# Patient Record
Sex: Female | Born: 1955 | Race: White | Hispanic: No | Marital: Married | Smoking: Former smoker
Health system: Southern US, Community
[De-identification: ages and names within clinical notes are randomized; demographics above are authoritative.]

## PROBLEM LIST (undated history)

## (undated) DIAGNOSIS — N809 Endometriosis, unspecified: Secondary | ICD-10-CM

## (undated) DIAGNOSIS — K7030 Alcoholic cirrhosis of liver without ascites: Secondary | ICD-10-CM

## (undated) DIAGNOSIS — F329 Major depressive disorder, single episode, unspecified: Secondary | ICD-10-CM

## (undated) DIAGNOSIS — K219 Gastro-esophageal reflux disease without esophagitis: Secondary | ICD-10-CM

## (undated) DIAGNOSIS — K922 Gastrointestinal hemorrhage, unspecified: Secondary | ICD-10-CM

## (undated) DIAGNOSIS — D649 Anemia, unspecified: Secondary | ICD-10-CM

## (undated) DIAGNOSIS — J449 Chronic obstructive pulmonary disease, unspecified: Secondary | ICD-10-CM

## (undated) DIAGNOSIS — F419 Anxiety disorder, unspecified: Secondary | ICD-10-CM

## (undated) DIAGNOSIS — F32A Depression, unspecified: Secondary | ICD-10-CM

## (undated) DIAGNOSIS — K589 Irritable bowel syndrome without diarrhea: Secondary | ICD-10-CM

## (undated) HISTORY — DX: Major depressive disorder, single episode, unspecified: F32.9

## (undated) HISTORY — PX: TUBAL LIGATION: SHX77

## (undated) HISTORY — DX: Endometriosis, unspecified: N80.9

## (undated) HISTORY — DX: Depression, unspecified: F32.A

## (undated) HISTORY — PX: TONSILLECTOMY: SUR1361

## (undated) HISTORY — DX: Irritable bowel syndrome, unspecified: K58.9

## (undated) HISTORY — PX: OTHER SURGICAL HISTORY: SHX169

## (undated) HISTORY — PX: LAPAROSCOPIC LYSIS OF ADHESIONS: SHX5905

## (undated) HISTORY — PX: DILATION AND CURETTAGE OF UTERUS: SHX78

---

## 2006-04-26 ENCOUNTER — Ambulatory Visit: Payer: Self-pay | Admitting: Obstetrics and Gynecology

## 2006-08-10 ENCOUNTER — Emergency Department: Payer: Self-pay | Admitting: Emergency Medicine

## 2010-03-20 ENCOUNTER — Ambulatory Visit: Payer: Self-pay | Admitting: Unknown Physician Specialty

## 2010-04-05 ENCOUNTER — Ambulatory Visit: Payer: Self-pay | Admitting: Unknown Physician Specialty

## 2010-04-06 LAB — PATHOLOGY REPORT

## 2012-08-04 ENCOUNTER — Emergency Department: Payer: Self-pay | Admitting: Emergency Medicine

## 2012-08-04 LAB — URINALYSIS, COMPLETE
Bilirubin,UR: NEGATIVE
Blood: NEGATIVE
Glucose,UR: NEGATIVE mg/dL (ref 0–75)
Leukocyte Esterase: NEGATIVE
Nitrite: NEGATIVE
RBC,UR: 1 /HPF (ref 0–5)
Squamous Epithelial: 9

## 2012-08-04 LAB — COMPREHENSIVE METABOLIC PANEL
Albumin: 3.4 g/dL (ref 3.4–5.0)
Alkaline Phosphatase: 134 U/L (ref 50–136)
Anion Gap: 13 (ref 7–16)
BUN: 5 mg/dL — ABNORMAL LOW (ref 7–18)
Bilirubin,Total: 1.3 mg/dL — ABNORMAL HIGH (ref 0.2–1.0)
Calcium, Total: 8.8 mg/dL (ref 8.5–10.1)
Chloride: 101 mmol/L (ref 98–107)
Creatinine: 0.57 mg/dL — ABNORMAL LOW (ref 0.60–1.30)
EGFR (African American): >60
Osmolality: 269 (ref 275–301)
Potassium: 3.1 mmol/L — ABNORMAL LOW (ref 3.5–5.1)

## 2012-08-04 LAB — CBC
HGB: 16.2 g/dL — ABNORMAL HIGH (ref 12.0–16.0)
MCH: 35.8 pg — ABNORMAL HIGH (ref 26.0–34.0)
MCHC: 35.5 g/dL (ref 32.0–36.0)
MCV: 101 fL — ABNORMAL HIGH (ref 80–100)
Platelet: 130 10*3/uL — ABNORMAL LOW (ref 150–440)
RBC: 4.54 10*6/uL (ref 3.80–5.20)
RDW: 12.5 % (ref 11.5–14.5)

## 2012-08-04 LAB — LIPASE, BLOOD: Lipase: 90 U/L (ref 73–393)

## 2012-08-04 LAB — RAPID INFLUENZA A&B ANTIGENS

## 2012-08-06 LAB — URINE CULTURE

## 2012-08-10 LAB — CULTURE, BLOOD (SINGLE)

## 2012-12-04 ENCOUNTER — Ambulatory Visit: Payer: Self-pay | Admitting: Otolaryngology

## 2012-12-10 ENCOUNTER — Ambulatory Visit: Payer: Self-pay | Admitting: Unknown Physician Specialty

## 2013-02-10 ENCOUNTER — Ambulatory Visit: Payer: Self-pay

## 2013-02-10 LAB — CBC WITH DIFFERENTIAL/PLATELET
Basophil %: 1.5 %
Eosinophil %: 1.3 %
HCT: 41.6 % (ref 35.0–47.0)
HGB: 14.4 g/dL (ref 12.0–16.0)
Lymphocyte #: 0.3 10*3/uL — ABNORMAL LOW (ref 1.0–3.6)
Lymphocyte %: 11.2 %
MCH: 34.2 pg — ABNORMAL HIGH (ref 26.0–34.0)
MCHC: 34.6 g/dL (ref 32.0–36.0)
Monocyte %: 10.4 %
Neutrophil #: 2.1 10*3/uL (ref 1.4–6.5)
Neutrophil %: 75.6 %
Platelet: 96 10*3/uL — ABNORMAL LOW (ref 150–440)
RDW: 14.5 % (ref 11.5–14.5)
WBC: 2.8 10*3/uL — ABNORMAL LOW (ref 3.6–11.0)

## 2013-02-10 LAB — COMPREHENSIVE METABOLIC PANEL
Albumin: 3.5 g/dL (ref 3.4–5.0)
Alkaline Phosphatase: 195 U/L — ABNORMAL HIGH (ref 50–136)
Anion Gap: 12 (ref 7–16)
BUN: 7 mg/dL (ref 7–18)
Calcium, Total: 8.9 mg/dL (ref 8.5–10.1)
Chloride: 97 mmol/L — ABNORMAL LOW (ref 98–107)
Co2: 23 mmol/L (ref 21–32)
Creatinine: 0.77 mg/dL (ref 0.60–1.30)
Glucose: 110 mg/dL — ABNORMAL HIGH (ref 65–99)
Potassium: 3.6 mmol/L (ref 3.5–5.1)
SGOT(AST): 389 U/L — ABNORMAL HIGH (ref 15–37)
Sodium: 132 mmol/L — ABNORMAL LOW (ref 136–145)

## 2013-02-10 LAB — URINALYSIS, COMPLETE
Bilirubin,UR: NEGATIVE
Glucose,UR: NEGATIVE mg/dL (ref 0–75)
Ketone: NEGATIVE
Leukocyte Esterase: NEGATIVE
Ph: 8 (ref 4.5–8.0)

## 2013-02-10 LAB — MAGNESIUM: Magnesium: 1.2 mg/dL — ABNORMAL LOW

## 2013-02-13 LAB — BETA STREP CULTURE(ARMC)

## 2013-03-04 ENCOUNTER — Ambulatory Visit: Payer: Self-pay | Admitting: Family Medicine

## 2013-03-26 ENCOUNTER — Ambulatory Visit: Payer: Self-pay | Admitting: Otolaryngology

## 2013-10-01 ENCOUNTER — Ambulatory Visit: Payer: Self-pay | Admitting: Otolaryngology

## 2014-04-14 ENCOUNTER — Ambulatory Visit: Payer: Self-pay | Admitting: Unknown Physician Specialty

## 2014-04-20 ENCOUNTER — Emergency Department: Payer: Self-pay | Admitting: Emergency Medicine

## 2014-04-20 LAB — URINALYSIS, COMPLETE
BACTERIA: NONE SEEN
BILIRUBIN, UR: NEGATIVE
Blood: NEGATIVE
GLUCOSE, UR: NEGATIVE mg/dL (ref 0–75)
KETONE: NEGATIVE
Leukocyte Esterase: NEGATIVE
NITRITE: NEGATIVE
PH: 6 (ref 4.5–8.0)
Protein: NEGATIVE
RBC,UR: <1 /HPF (ref 0–5)
Specific Gravity: 1.004 (ref 1.003–1.030)
Squamous Epithelial: 1
WBC UR: 1 /HPF (ref 0–5)

## 2014-04-20 LAB — CBC WITH DIFFERENTIAL/PLATELET
BASOS PCT: 1.5 %
Basophil #: 0.1 10*3/uL (ref 0.0–0.1)
EOS ABS: 0.3 10*3/uL (ref 0.0–0.7)
Eosinophil %: 6.7 %
HCT: 41.7 % (ref 35.0–47.0)
HGB: 13.7 g/dL (ref 12.0–16.0)
LYMPHS ABS: 1.3 10*3/uL (ref 1.0–3.6)
LYMPHS PCT: 30.6 %
MCH: 31.3 pg (ref 26.0–34.0)
MCHC: 32.9 g/dL (ref 32.0–36.0)
MCV: 95 fL (ref 80–100)
Monocyte #: 0.4 x10 3/mm (ref 0.2–0.9)
Monocyte %: 9.7 %
NEUTROS ABS: 2.1 10*3/uL (ref 1.4–6.5)
Neutrophil %: 51.5 %
Platelet: 101 10*3/uL — ABNORMAL LOW (ref 150–440)
RBC: 4.38 10*6/uL (ref 3.80–5.20)
RDW: 12.7 % (ref 11.5–14.5)
WBC: 4.2 10*3/uL (ref 3.6–11.0)

## 2014-04-20 LAB — COMPREHENSIVE METABOLIC PANEL
ALBUMIN: 3.8 g/dL (ref 3.4–5.0)
AST: 152 U/L — AB (ref 15–37)
Alkaline Phosphatase: 155 U/L — ABNORMAL HIGH
Anion Gap: 10 (ref 7–16)
BILIRUBIN TOTAL: 0.7 mg/dL (ref 0.2–1.0)
BUN: 7 mg/dL (ref 7–18)
CALCIUM: 9 mg/dL (ref 8.5–10.1)
CHLORIDE: 107 mmol/L (ref 98–107)
CREATININE: 0.69 mg/dL (ref 0.60–1.30)
Co2: 24 mmol/L (ref 21–32)
EGFR (African American): >60
EGFR (Non-African Amer.): >60
Glucose: 124 mg/dL — ABNORMAL HIGH (ref 65–99)
Osmolality: 281 (ref 275–301)
POTASSIUM: 3.5 mmol/L (ref 3.5–5.1)
SGPT (ALT): 99 U/L — ABNORMAL HIGH
Sodium: 141 mmol/L (ref 136–145)
Total Protein: 7.6 g/dL (ref 6.4–8.2)

## 2014-04-20 LAB — LIPASE, BLOOD: Lipase: 187 U/L (ref 73–393)

## 2014-04-20 LAB — TROPONIN I: Troponin-I: <0.02 ng/mL

## 2014-10-26 ENCOUNTER — Ambulatory Visit
Admit: 2014-10-26 | Disposition: A | Discharge status: Home / Self Care | Payer: Self-pay | Attending: Oncology | Admitting: Oncology

## 2014-11-11 ENCOUNTER — Ambulatory Visit: Payer: Self-pay | Admitting: Family Medicine

## 2014-11-11 ENCOUNTER — Inpatient Hospital Stay: Payer: Self-pay | Admitting: Internal Medicine

## 2014-11-26 ENCOUNTER — Ambulatory Visit
Admit: 2014-11-26 | Disposition: A | Discharge status: Home / Self Care | Payer: Self-pay | Attending: Oncology | Admitting: Oncology

## 2014-11-26 LAB — CBC CANCER CENTER
Basophil #: 0.1 x10 3/mm (ref 0.0–0.1)
Basophil %: 2.4 %
EOS PCT: 2.8 %
Eosinophil #: 0.1 x10 3/mm (ref 0.0–0.7)
HCT: 31.8 % — ABNORMAL LOW (ref 35.0–47.0)
HGB: 10.4 g/dL — AB (ref 12.0–16.0)
LYMPHS ABS: 0.8 x10 3/mm — AB (ref 1.0–3.6)
Lymphocyte %: 27.9 %
MCH: 27.9 pg (ref 26.0–34.0)
MCHC: 32.7 g/dL (ref 32.0–36.0)
MCV: 85 fL (ref 80–100)
MONOS PCT: 8.8 %
Monocyte #: 0.2 x10 3/mm (ref 0.2–0.9)
Neutrophil #: 1.6 x10 3/mm (ref 1.4–6.5)
Neutrophil %: 58.1 %
Platelet: 103 x10 3/mm — ABNORMAL LOW (ref 150–440)
RBC: 3.73 10*6/uL — AB (ref 3.80–5.20)
RDW: 17.9 % — ABNORMAL HIGH (ref 11.5–14.5)
WBC: 2.8 x10 3/mm — ABNORMAL LOW (ref 3.6–11.0)

## 2014-11-26 LAB — IRON AND TIBC
IRON BIND. CAP.(TOTAL): 291 (ref 250–450)
IRON: 59 ug/dL
Iron Saturation: 20.3
Unbound Iron-Bind.Cap.: 231.8

## 2014-11-26 LAB — LACTATE DEHYDROGENASE: LDH: 124 U/L

## 2014-11-26 LAB — FERRITIN: FERRITIN (ARMC): 198 ng/mL

## 2014-11-26 LAB — RETICULOCYTES
Absolute Retic Count: 0.0935 10*6/uL (ref 0.019–0.186)
Reticulocyte: 2.48 % (ref 0.4–3.1)

## 2014-12-11 LAB — CULTURE, BLOOD (SINGLE)

## 2014-12-26 NOTE — Consult Note (Signed)
PATIENT NAME:  Holly Evans, Holly Evans MR#:  [id number] DATE OF BIRTH:  Jul 08, 1956  DATE OF CONSULTATION:  11/12/2014  REFERRING PHYSICIAN:   CONSULTING PHYSICIAN:  Cala BradfordKimberly A. Arvilla MarketMills, ANP (Adult Nurse Practitioner)  REFERRING PHYSICIAN:  Enid Baasadhika Kalisetti, MD.    CONSULTING PHYSICIAN:  Lynnae Prudeobert Elliott, MD/Lorene Samaan Arvilla MarketMills, ANP.  REASON FOR CONSULTATION:  Anemia with melena.   HISTORY OF PRESENT ILLNESS: This 59 year old Caucasian female with history of ongoing alcohol abuse, depression, IBS, and fatty liver, pancytopenia presented to the Emergency Room from the urgent care secondary to fever, cough, vomiting and inability to "keep anything down for 3 days." The patient reports for the last few days she has had episodes of vomiting, fever, cough productive of light yellow sputum. She has noted black stools a few times last week. There has been minor diarrhea. No hematemesis. She has noticed bleeding gums and sought care from her dentist who could not find anything wrong with her gums.  Her ribs hurt bilaterally from coughing. She initially thought this was a cold.  She presented to the Emergency Room with a fever of 101 degrees, WBC 2.0, hemoglobin 9.3, platelet count 71,000, AST 188, alkaline phosphatase 125, bilirubin 1.5, and albumin 3.3. Lipase was 26. Stool was positive for occult blood. INR 1.3. Chest x-ray showed a right basilar opacity, may reflect atelectasis versus pneumonia.   She was subsequently hospitalized last evening and started on IV antibiotic therapy for sepsis. Hemoglobin has been monitored and dropped to 7.4 today. She has been on IV Protonix drip. Abdominal ultrasound showed marked splenomegaly. Consultation with oncology has been requested.   PAST MEDICAL HISTORY:  1. Depression.  2. Irritable bowel syndrome.  3. Alcohol abuse.  4. Fatty liver with mild ascites in the past.  5. Insomnia.  6. COPD.    PAST SURGICAL HISTORY:   1. Tubal ligation.  2. D and C.  3. Tonsillectomy.   4. Lysis of abdominal adhesions.  5. Thermal ablation endometrium.  6. Colonoscopy in 2011, hyperplastic colon polyps.   ALLERGIES:  TO SULFA.     CURRENT MEDICATIONS:  1. Prozac 40 mg daily.  2. Probiotics 1 capsule daily.  3. Omeprazole 20 mg daily.   SOCIAL HISTORY: Lives at home with her husband. Quit smoking 3 years ago. Drinks 4-5 glasses of vodka every day.   FAMILY HISTORY: Father deceased with sudden death in his 6450s. Negative for cancer.   REVIEW OF SYSTEMS: Positive for fever, fatigue, weakness, cough, sputum production, a little short of breath. Positive for rib discomfort bilateral. No discreet abdominal pain. She has noted no increase in abdominal girth or edema. She denies history of ascites or paracentesis. She has had vomiting, unable to retain fluids for 2 days prior to admission. Black stools noted last week, a little diarrhea. Positive gum bleeding. Remaining 10 systems otherwise negative.   PHYSICAL EXAMINATION:  VITAL SIGNS: 98.9, 89, 20, 116/72, pulse oximetry on room air is 93%.  GENERAL:  Well-nourished Caucasian female, looks fatigue, resting in bed. Noncritical.  HEENT: Head is normocephalic. Conjunctivae pink. Sclerae are anicteric.  NECK: Supple. Trachea midline.  CARDIAC: S1, S2 without murmur or gallop.  PULMONARY: Decreased breath sounds bilateral, few crackles, diffuse wheezes noted. Little cough noted.  ABDOMEN:  Soft and a little protuberant. Nontender in all quadrants. Positive splenomegaly.  EXTREMITIES: Lower extremities without edema, cyanosis, or clubbing.  SKIN: Warm and dry without rash. Spider angiomata NEUROLOGIC: Cranial nerves grossly intact. Follows commands.  PSYCHOLOGICAL: Affect and mood within normal.  She is alert and awake, interacting appropriately with her husband.  LABORATORY: Laboratory studies dated 11/11/2014 with BUN 8, creatinine 0.62, glucose 97. Electrolytes unremarkable. Calcium 7.8. Ferritin 44. TSH 1.153. WBC 1.6,  hemoglobin 8.1, platelets 63,000. MCV 85. Blood culture negative x 2.  Urinalysis, 1 + blood, 3 WBCs per high-power field, positive mucus.   Repeat laboratory studies today with hemoglobin 7.4.   RADIOLOGY: Chest x-ray PA and lateral performed 11/11/2014 with minimal apparent right basilar opacity, may reflect atelectasis or possibly mild pneumonia.   Abdominal ultrasound, limited survey for pancytopenia and splenomegaly showed marked splenomegaly. Accurate measurement difficult as spleen extended beyond the limits of the imaging field. The spleen is homogenous, without focal mass or fluid collection. No mention of the liver.   IMPRESSION:  1.  The patient presents with cough, fever, sputum production, and abnormal chest x-ray, and sepsis, favoring diagnosis of pneumonia. She has been started on Rocephin and azithromycin. Nebulizers have been ordered as needed. Lung exam confirms.  2.  She reports nausea and vomiting without hematemesis. She reports black stools last week. She has experienced gum bleeding. She reports poor intake over 2 days. This may be related to her initial infection, but the patient is at high risk for ulcer, gastritis, erosive gastritis, portal hypertensive gastropathy, and spontaneous bacterial peritonitis (no abdominal pain or tenderness).  3.  History of alcoholic liver disease and severe pancytopenia supproting cirrhosis. Other diagnoses to be determined pending  hematology consult.   PLAN:  1.  Recommend liver laboratories with hepatitis A, Evans, C, ANA, iron, and TIBC.  2.  Serial laboratory work.  INR is 1.3. Albumin is 3.3, which is good. She will need eventual CT of the abdomen.  3.  No recommendation for luminal evaluation at this time, but certainly would consider as her clinical status improves. We will need to follow her along daily for improvement in her pulmonary and hematological status.   Thank you for the consultation. This case was discussed with Lynnae Prude, MD in collaboration of care.   These services provided by Cala Bradford A. Arvilla Market, MS, APRN, BC, ANP under collaborative agreement with Lynnae Prude, MD.     ____________________________ Ranae Plumber Arvilla Market, ANP (Adult Nurse Practitioner) kam:bu D: 11/12/2014 15:19:16 ET T: 11/12/2014 15:37:13 ET JOB#: [id number]  cc: Cala Bradford A. Arvilla Market, ANP (Adult Nurse Practitioner), <Dictator> Ranae Plumber Suzette Battiest, MSN, ANP-BC Adult Nurse Practitioner ELECTRONICALLY SIGNED 11/12/2014 17:13

## 2014-12-26 NOTE — H&P (Signed)
PATIENT NAME:  Holly Evans, Holly Evans MR#:  [id number] DATE OF BIRTH:  1956-07-14  DATE OF ADMISSION:  11/11/2014  ADMITTING PHYSICIAN: Enid Baas, MD   PRIMARY CARE PHYSICIAN: [facility] in [place].   CHIEF COMPLAINT: Cough, diarrhea, vomiting, and abdominal pain and fevers.   HISTORY OF PRESENT ILLNESS: Ms. Dolson is a 59 year old Caucasian female with past medical history significant for alcohol abuse, depression, IBS, fatty liver, presents to the hospital  from urgent care secondary to fever and pancytopenia. The patient states her symptoms started about 5-6 days ago. Initially, she had cold, congestion, upper respiratory tract infection symptoms with worsening cough. Her cough became very productive, and she started vomiting after coughing spell. She has been having fevers for the last couple of days and she had a temperature of 101 degrees Fahrenheit at the urgent care. She denies any chest pain. Denies any dyspnea. She does have chronic diarrhea and that has not changed; however, she noticed that her stools were dark and tarry. She was told that she has fatty liver and mild ascites about a couple of years ago. No further follow-up was done. She is noted to have decreased white blood cells, red blood cells, and platelets here. No active bleeding at this time. Vitals are otherwise stable.   PAST MEDICAL HISTORY: 1. Depression.  2. Irritable bowel syndrome.  3. Fatty liver and mild ascites in the past.   PAST SURGICAL HISTORY: 1. Breast implants.  2. Endometriosis surgery.  3. D and Cs.  ALLERGIES TO MEDICATIONS: SULFA.   CURRENT HOME MEDICATIONS:  1. Prozac 40 mg p.o. daily.  2. Probiotics 1 capsule daily.  3. Omeprazole 20 mg p.o. daily.   SOCIAL HISTORY: Lives at home with her husband. Quit smoking about 3 years ago. Prior to that, was smoking about a pack per day. Drinks about 4-5 glasses of vodka every day.    FAMILY HISTORY: Dad died from sudden death when  in his 67s, and  mom is 33 and is still healthy and drives around.   REVIEW OF SYSTEMS:  CONSTITUTIONAL: Positive for fever, fatigue, and weakness.  EYES: No blurred vision, double vision, inflammation, or glaucoma.  EARS, NOSE, AND THROAT: No tinnitus, ear pain, hearing loss, epistaxis, or discharge.  RESPIRATORY: Positive for cough. No wheeze. No COPD, hemoptysis.  CARDIOVASCULAR: No chest pain, orthopnea, edema, arrhythmia, palpitations, or syncope.  GASTROINTESTINAL: Positive for nausea, vomiting, and diarrhea. Positive for abdominal pain. No hematemesis. Positive for melena.  GENITOURINARY: No dysuria, hematuria, renal calculus, frequency, or incontinence.  ENDOCRINE: No polyuria, nocturia, thyroid problems, heat or cold intolerance.  HEMATOLOGY: No anemia, easy bruising or bleeding.  SKIN: No acne, rash or lesions.  MUSCULOSKELETAL: No neck, back, shoulder pain, arthritis, or gout.  NEUROLOGIC: No numbness, weakness, CVA, TIA, or seizures.  PSYCHOLOGIC: No anxiety, insomnia, or depression.   PHYSICAL EXAMINATION: VITAL SIGNS: Temperature 101 degrees Fahrenheit, pulse is 111, blood pressure is 132/76, pulse oximetry is 97% on room air.  GENERAL: Well-built, well-nourished female, sitting in bed, not in any acute distress.  HEENT: Normocephalic, atraumatic. Pupils are equal, round, reacting to light. Anicteric sclerae. Extraocular movements intact. Oropharynx is clear without erythema, mass, or exudates.  NECK: Supple. No thyromegaly, JVD, or carotid bruits. No lymphadenopathy.  LUNGS: Moving air bilaterally. Decreased bibasilar breath sounds and coarse rhonchi at the bases, more worse on the right side. No wheeze. No crackles heard. No use of accessory muscles for breathing.  CARDIOVASCULAR: S1, S2, regular rate and rhythm. No  murmurs, rubs, or gallops.  ABDOMEN: Soft, nontender, nondistended. No hepatosplenomegaly. Normal bowel sounds.  EXTREMITIES: No pedal edema. No clubbing or cyanosis, 2+ dorsalis  pedis pulses palpable bilaterally.  SKIN: No acne, rash, or lesions.   NEUROLOGIC: Cranial nerves intact. No focal motor or sensory deficits.  PSYCHOLOGICAL: The patient is awake, alert x 3.   LABORATORY DATA: WBC 2.0, hemoglobin 9.3, hematocrit 28.0, platelet count 71,000.  Sodium 134, potassium 3.7, chloride 104, bicarbonate 22, BUN 9, creatinine 0.64, glucose 135, and calcium of 8.5. ALT 58, AST 188, alkaline phosphatase 125, total bilirubin 1.5 and albumin of 3.3. Lipase is 26. Stool for Occult blood is positive. INR is 1.3. Insulin test is negative. Chest x-ray showing well aerated lungs; however, right basilar opacity may reflect atelectasis or possibly pneumonia.   ASSESSMENT AND PLAN: This is a 59 year old female with history of gastroesophageal reflux disease, alcohol abuse, irritable bowel syndrome, and depression, went to urgent care for cough, fever, noted to be tachycardic and pancytopenic.  1. Sepsis with tachycardia, fever secondary to pneumonia. Blood cultures have been ordered. Start her on Rocephin and azithromycin at this time. Nebulizers p.r.n. and cough medicine have been added, as well.  2. Melena. Check hemoglobin every 6-8 hours. Gastroenterology has been consulted. Started on IV Protonix b.i.d. at this time. Last colonoscopy was 5 years ago showing only polyps, nonmalignant. No esophagogastroduodenoscopy noted. No history of nonsteroidal anti-inflammatory drug use. Monitor for now.  3. Pancytopenia known history of alcohol abuse. Could be alcohol-induced pancytopenia from chronic alcohol use versus viral induced. She probably has underlying cirrhosis. Check reticulocyte count, follow up manual differential count, parvovirus PCR, ultrasound abdomen to evaluate for splenomegaly and oncology has been consulted. ANC is noted to be 1300.  4. Depression. Continue Prozac.  5. Alcohol abuse. Placed on CIWA protocol.  6. Deep vein thrombosis prophylaxis, TEDs and SCDs only.   CODE  STATUS: Full code.   TIME SPENT ON ADMISSION: 50 minutes.    ____________________________ Enid Baasadhika Telia Amundson, MD rk:mw D: 11/11/2014 19:22:12 ET T: 11/11/2014 19:56:34 ET JOB#: [id number]  cc: Enid Baasadhika Gabriellah Rabel, MD, <Dictator> [facility] [place] Enid BaasADHIKA Stephenson Cichy MD ELECTRONICALLY SIGNED 11/20/2014 17:54

## 2014-12-26 NOTE — Discharge Summary (Signed)
PATIENT NAME:  Holly Evans, Holly Evans MR#:  [id number] DATE OF BIRTH:  [birth year]  DATE OF ADMISSION:  11/11/2014 DATE OF DISCHARGE:  11/14/2014  PRIMARY CARE PHYSICIAN: Dr. Elmer Ramp - [place]  CHIEF COMPLAINT: Cough, vomiting, abdominal pain and fevers.   DISCHARGE DIAGNOSES: 1.  Pneumonia.  2.  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  3.  Iron deficiency anemia.  4.  History of alcohol abuse.   CONDITION AT DISCHARGE:  Saturations 91 to 93% on room.   CODE STATUS: FULL.  DISCHARGE MEDICATIONS:  1.  Keflex 500 mg b.i.d.  2.  Azithromycin 250 p.o. daily.  3.  Multivitamin p.o. daily.  4.  Tessalon Perles 100 mg 1 capsule q. 6 hourly as needed.  5.  Cetirizine 10 mg daily.  6.  Fluoxetine 40 mg p.o. daily.  7.  Omeprazole 20 mg daily.   DISCHARGE DIET: Regular, mechanical soft.  DISCHARGE INSTRUCTIONS: 1.  Follow up with Dr. Orlie Dakin, [facility], for anemia.  2.  Follow up with Dr. Elmer Ramp at [facility].  3.  Follow up with Dr. Mechele Collin, gastroenterology, in 2 to 4 weeks for cirrhosis of the liver and further gastrointestinal evaluation.   DIAGNOSTIC DATA: Ultrasound of the abdomen showed abundant sludge and numerous small gallstones with mild gallbladder wall thickening. Gallbladder wall thickening may be related to adjacent hepatic parenchymal disease. There is no sonographic Murphy sign. Diffusely coarse hepatic echotexture with mild nodularity consistent with diagnosis of cirrhosis.  Hepatitis panel is negative, except hepatitis A antibody is positive.   Acetaminophen 23. Hemoglobin and hematocrit 7.9 and 25.1, white count 3.1, and platelet count 61,000. Metabolic panel within normal limits, except potassium of 2.2 and bicarb of 19.   Urinalysis negative for UTI. Blood cultures negative in 48 hours.   Chest x-ray showed minimal apparent right basilar opacity, may reflect atelectasis and/or possibly mild pneumonia.   CONSULTANTS:  1.  Gastroenterology - Dr. Mechele Collin.  2.   Oncology/hematology - Dr. Orlie Dakin.  BRIEF SUMMARY OF HOSPITAL COURSE: Holly Evans is a 59 year old Caucasian female with past medical history of alcohol abuse, heartburn, irritable bowel syndrome and depression. She went to urgent care with worsening cough and melena for 5 days. She was noted to have fever of 101, was admitted with:  1.  Sepsis, which was present on admission with tachycardia, fever. Suspected due to pneumonia. Resolved during the hospital stay. She was started on IV Rocephin and Zithromax, changed to p.o. Keflex and Zithromax. Blood cultures remained negative. P.r.n. cough medicine was given. Pancytopenia with known history of alcohol abuse. Neutropenic precautions were carried out. She was started on IV Feraheme per Dr. Orlie Dakin for her iron deficiency anemia and she will follow up with him as outpatient.  2.  GI bleed. Given melena and history of alcohol abuse, possibly has underlying gastritis/esophageal varices. Hemoglobin and hematocrit were stable. No active bleeding noted. She was seen by Dr. Mechele Collin. The patient was advised to follow up with Dr. Mechele Collin to get further GI evaluation as outpatient.  3.  Cirrhosis of liver, alcohol-induced, with abdominal ultrasound positive for cirrhosis of liver. The patient was advised abstinence from alcohol. She agreed to it. 4.  Depression. Continue Prozac.   Hospital stay otherwise remained stable. The patient remained a FULL code.   TIME SPENT: 40 minutes. ____________________________ Wylie Hail Allena Katz, MD sap:sb D: 11/15/2014 07:23:35 ET T: 11/15/2014 08:10:58 ET JOB#: [id number]  cc: Eleonora Peeler A. Allena Katz, MD, <Dictator> Tollie Pizza. Orlie Dakin, MD Dr. Leanna Sato, MD  [name] MD ELECTRONICALLY SIGNED 11/29/2014 12:16

## 2014-12-26 NOTE — Consult Note (Signed)
Pt with coughing up a little blood, chest exam with decreased breath sounds right base, some on left.  WBC 2.1, plt ct 61K.  US showed sludge and stones and cirrhosis.  VSS afebrile. No new suggestions but I did suggest to her that she go to AA after discharge if she was serious about stopping alcohol.  Electronic Signatures: Scot JunElliott, Robert T (MD)  (Signed on 19-Mar-16 12:15)  Authored  Last Updated: 19-Mar-16 12:15 by Scot JunElliott, Robert T (MD)

## 2014-12-26 NOTE — Consult Note (Signed)
Note Type Consult   Subjective: Chief Complaint/Diagnosis:   Pancytopenia HPI:   Patient is a 59 year old female who presented to the emergency room complaining of increased cough, diarrhea, vomiting, and abdominal pain. She also reported fevers. Patient's symptoms started approximately one week ago and became worse. Currently she feels improved, but not back to baseline. She has no neurologic complaints. She has good appetite and denies weight loss. She denies any chest pain or shortness of breath. She has no urinary complaints. Patient otherwise feels well and offers no further specific complaints.   Review of Systems:  Performance Status (ECOG): 0  Skin: no complaints  no easy bleeding or bruising.  Review of Systems:   As per HPI. Otherwise, a complete review of systems is negative.   Allergies:  Other -Explain in Comment: Anaphylaxis  Sulfa drugs: N/V/Diarrhea  PFSH: Additional Past Medical and Surgical History: depression, IBS, breast implants, endometriosis, D&C.    Family history: Father "sudden death" from unknown causes in his 43s. No other pertinent family history.    Social history: Positive tobacco, quit 3 years ago. Heavy alcohol use approximately 4-5 glasses of vodka daily.   Home Medications: Medication Instructions Last Modified Date/Time  omeprazole 20 mg oral delayed release tablet 1 tab(s) orally once a day (in the morning) 17-Mar-16 18:29  FLUoxetine 40 mg oral capsule 1 cap(s) orally once a day (in the morning) 17-Mar-16 18:29  cetirizine 10 mg oral tablet 1 tab(s) orally once a day (in the morning) 17-Mar-16 18:29   Vital Signs:  :: vital signs stable, patient afebrile.   Physical Exam:  General: well developed, well nourished, and in no acute distress  Mental Status: normal affect  Eyes: anicteric sclera  Respiratory: clear to auscultation bilaterally  Cardiovascular: regular rate and rhythm, no murmur, rub, or gallop  Gastrointestinal: soft,  nondistended, nontender, no organomegaly.  normal active bowel sounds  Musculoskeletal: No edema  Skin: No rash or petechiae noted  Neurological: alert, answering all questions appropriately.  Cranial nerves grossly intact   Laboratory Results: Routine Chem:  19-Mar-16 04:31   Glucose, Serum 95 (65-99 NOTE: New Reference Range  10/19/14)  BUN 6 (6-20 NOTE: New Reference Range  10/19/14)  Creatinine (comp) 0.58 (0.44-1.00 NOTE: New Reference Range  10/19/14)  Sodium, Serum 139 (135-145 NOTE: New Reference Range  10/19/14)  Potassium, Serum  3.2 (3.5-5.1 NOTE: New Reference Range  10/19/14)  Chloride, Serum  114 (101-111 NOTE: New Reference Range  10/19/14)  CO2, Serum  19 (22-32 NOTE: New Reference Range  10/19/14)  Calcium (Total), Serum  7.9 (8.9-10.3 NOTE: New Reference Range  10/19/14)  Anion Gap  6  eGFR (African American) >60  eGFR (Non-African American) >60 (eGFR values <42m/min/1.73 m2 may be an indication of chronic kidney disease (CKD). Calculated eGFR is useful in patients with stable renal function. The eGFR calculation will not be reliable in acutely ill patients when serum creatinine is changing rapidly. It is not useful in patients on dialysis. The eGFR calculation may not be applicable to patients at the low and high extremes of body sizes, pregnant women, and vegetarians.)  Iron Binding Capacity (TIBC) 363  Unbound Iron Binding Capacity 340.0  Iron, Serum  23 (28-170 NOTE: New Reference Range  10/19/14)  Iron Saturation 6 (Result(s) reported on 13 Nov 2014 at 07:19AM.)  Routine Hem:  19-Mar-16 04:31   WBC (CBC)  2.1  RBC (CBC)  2.95  Hemoglobin (CBC)  7.9  Hematocrit (CBC)  25.1  Platelet  Count (CBC)  61  MCV 85  MCH 26.9  MCHC  31.6  RDW  14.7  Neutrophil % 43.2  Lymphocyte % 42.2  Monocyte % 8.7  Eosinophil % 4.7  Basophil % 1.2  Neutrophil #  0.9  Lymphocyte #  0.9  Monocyte # 0.2  Eosinophil # 0.1  Basophil # 0.0 (Result(s)  reported on 13 Nov 2014 at [facility].)   Lab Results Review:  Lab Results     Medical Imaging Results:   Review Medical Imaging   Abdomen Limited Survey 12-Nov-2014 08:17:00: IMPRESSION:  Marked splenomegaly.  No focalsplenic abnormality.      Electronically Signed    By: Franchot Gallo M.D.    On: 11/12/2014 08:23     Verified By: Truett Perna, M.D.,  Assessment and Plan: Impression:   pancytopenia. Plan:   1. Pancytopenia: Likely multifactorial given patient's heavy alcoholism, splenomegaly, and iron deficiency anemia. Possible pneumonia and acute illness also likely contributing.  She also had heme positive stools and GI is following. Will give 510 mg IV Feraheme today. Patient can follow-up in the [place] in 3-4 weeks with repeat laboratory work and further evaluation. consult, call with questions.  Electronic Signatures: Delight Hoh (MD)  (Signed 19-Mar-16 15:03)  Authored: Note Type, CC/HPI, Review of Systems, ALLERGIES, Patient Family Social History, HOME MEDICATIONS, Vital Signs, Physical Exam, Lab Results Review, Rad Results Review, Assessment and Plan   Last Updated: 19-Mar-16 15:03 by Delight Hoh (MD)

## 2015-01-20 ENCOUNTER — Other Ambulatory Visit: Payer: Self-pay | Admitting: *Deleted

## 2015-01-20 DIAGNOSIS — D61818 Other pancytopenia: Secondary | ICD-10-CM

## 2015-01-21 ENCOUNTER — Other Ambulatory Visit: Payer: Self-pay

## 2015-01-21 ENCOUNTER — Ambulatory Visit: Payer: Self-pay | Admitting: Oncology

## 2015-01-28 ENCOUNTER — Encounter: Payer: Self-pay | Admitting: Oncology

## 2015-01-28 ENCOUNTER — Inpatient Hospital Stay: Payer: BLUE CROSS/BLUE SHIELD | Attending: Oncology

## 2015-01-28 ENCOUNTER — Inpatient Hospital Stay (HOSPITAL_BASED_OUTPATIENT_CLINIC_OR_DEPARTMENT_OTHER): Payer: BLUE CROSS/BLUE SHIELD | Admitting: Oncology

## 2015-01-28 VITALS — BP 140/65 | HR 84 | Temp 97.1°F | Resp 18 | Wt 150.8 lb

## 2015-01-28 DIAGNOSIS — K7030 Alcoholic cirrhosis of liver without ascites: Secondary | ICD-10-CM | POA: Insufficient documentation

## 2015-01-28 DIAGNOSIS — F329 Major depressive disorder, single episode, unspecified: Secondary | ICD-10-CM

## 2015-01-28 DIAGNOSIS — Z87891 Personal history of nicotine dependence: Secondary | ICD-10-CM

## 2015-01-28 DIAGNOSIS — D509 Iron deficiency anemia, unspecified: Secondary | ICD-10-CM | POA: Diagnosis not present

## 2015-01-28 DIAGNOSIS — K219 Gastro-esophageal reflux disease without esophagitis: Secondary | ICD-10-CM

## 2015-01-28 DIAGNOSIS — F1020 Alcohol dependence, uncomplicated: Secondary | ICD-10-CM | POA: Diagnosis not present

## 2015-01-28 DIAGNOSIS — J449 Chronic obstructive pulmonary disease, unspecified: Secondary | ICD-10-CM

## 2015-01-28 DIAGNOSIS — R161 Splenomegaly, not elsewhere classified: Secondary | ICD-10-CM | POA: Diagnosis not present

## 2015-01-28 DIAGNOSIS — Z8719 Personal history of other diseases of the digestive system: Secondary | ICD-10-CM

## 2015-01-28 DIAGNOSIS — D61818 Other pancytopenia: Secondary | ICD-10-CM

## 2015-01-28 DIAGNOSIS — D729 Disorder of white blood cells, unspecified: Secondary | ICD-10-CM

## 2015-01-28 DIAGNOSIS — F419 Anxiety disorder, unspecified: Secondary | ICD-10-CM

## 2015-01-28 DIAGNOSIS — K589 Irritable bowel syndrome without diarrhea: Secondary | ICD-10-CM

## 2015-01-28 LAB — CBC WITH DIFFERENTIAL/PLATELET
BASOS ABS: 0 10*3/uL (ref 0–0.1)
Basophils Relative: 2 %
Eosinophils Absolute: 0.1 10*3/uL (ref 0–0.7)
HEMATOCRIT: 28 % — AB (ref 35.0–47.0)
HEMOGLOBIN: 9 g/dL — AB (ref 12.0–16.0)
LYMPHS ABS: 0.9 10*3/uL — AB (ref 1.0–3.6)
Lymphocytes Relative: 41 %
MCH: 26 pg (ref 26.0–34.0)
MCHC: 32.3 g/dL (ref 32.0–36.0)
MCV: 80.6 fL (ref 80.0–100.0)
Monocytes Absolute: 0.2 10*3/uL (ref 0.2–0.9)
Neutro Abs: 0.9 10*3/uL — ABNORMAL LOW (ref 1.4–6.5)
Neutrophils Relative %: 42 %
Platelets: 65 10*3/uL — ABNORMAL LOW (ref 150–440)
RBC: 3.47 MIL/uL — ABNORMAL LOW (ref 3.80–5.20)
RDW: 15.5 % — AB (ref 11.5–14.5)
WBC: 2.2 10*3/uL — ABNORMAL LOW (ref 3.6–11.0)

## 2015-02-01 ENCOUNTER — Encounter: Payer: Self-pay | Admitting: *Deleted

## 2015-02-04 ENCOUNTER — Encounter: Payer: Self-pay | Admitting: Anesthesiology

## 2015-02-04 ENCOUNTER — Ambulatory Visit: Payer: BLUE CROSS/BLUE SHIELD | Admitting: Anesthesiology

## 2015-02-04 ENCOUNTER — Encounter
Admission: RE | Disposition: A | Discharge status: Home / Self Care | Payer: Self-pay | Source: Ambulatory Visit | Attending: Unknown Physician Specialty

## 2015-02-04 ENCOUNTER — Ambulatory Visit
Admission: RE | Admit: 2015-02-04 | Discharge: 2015-02-04 | Disposition: A | Discharge status: Home / Self Care | Payer: BLUE CROSS/BLUE SHIELD | Source: Ambulatory Visit | Attending: Unknown Physician Specialty | Admitting: Unknown Physician Specialty

## 2015-02-04 DIAGNOSIS — K589 Irritable bowel syndrome without diarrhea: Secondary | ICD-10-CM | POA: Insufficient documentation

## 2015-02-04 DIAGNOSIS — Z7951 Long term (current) use of inhaled steroids: Secondary | ICD-10-CM | POA: Insufficient documentation

## 2015-02-04 DIAGNOSIS — K766 Portal hypertension: Secondary | ICD-10-CM | POA: Insufficient documentation

## 2015-02-04 DIAGNOSIS — Z87891 Personal history of nicotine dependence: Secondary | ICD-10-CM | POA: Diagnosis not present

## 2015-02-04 DIAGNOSIS — K7030 Alcoholic cirrhosis of liver without ascites: Secondary | ICD-10-CM | POA: Insufficient documentation

## 2015-02-04 DIAGNOSIS — J449 Chronic obstructive pulmonary disease, unspecified: Secondary | ICD-10-CM | POA: Diagnosis not present

## 2015-02-04 DIAGNOSIS — Z79899 Other long term (current) drug therapy: Secondary | ICD-10-CM | POA: Diagnosis not present

## 2015-02-04 DIAGNOSIS — F419 Anxiety disorder, unspecified: Secondary | ICD-10-CM | POA: Diagnosis not present

## 2015-02-04 DIAGNOSIS — K7460 Unspecified cirrhosis of liver: Secondary | ICD-10-CM | POA: Diagnosis present

## 2015-02-04 DIAGNOSIS — F329 Major depressive disorder, single episode, unspecified: Secondary | ICD-10-CM | POA: Insufficient documentation

## 2015-02-04 DIAGNOSIS — K219 Gastro-esophageal reflux disease without esophagitis: Secondary | ICD-10-CM | POA: Diagnosis not present

## 2015-02-04 DIAGNOSIS — D509 Iron deficiency anemia, unspecified: Secondary | ICD-10-CM | POA: Insufficient documentation

## 2015-02-04 DIAGNOSIS — K2970 Gastritis, unspecified, without bleeding: Secondary | ICD-10-CM | POA: Insufficient documentation

## 2015-02-04 DIAGNOSIS — K222 Esophageal obstruction: Secondary | ICD-10-CM | POA: Diagnosis not present

## 2015-02-04 HISTORY — DX: Anemia, unspecified: D64.9

## 2015-02-04 HISTORY — DX: Gastrointestinal hemorrhage, unspecified: K92.2

## 2015-02-04 HISTORY — DX: Gastro-esophageal reflux disease without esophagitis: K21.9

## 2015-02-04 HISTORY — DX: Alcoholic cirrhosis of liver without ascites: K70.30

## 2015-02-04 HISTORY — PX: ESOPHAGOGASTRODUODENOSCOPY: SHX5428

## 2015-02-04 HISTORY — DX: Chronic obstructive pulmonary disease, unspecified: J44.9

## 2015-02-04 HISTORY — DX: Anxiety disorder, unspecified: F41.9

## 2015-02-04 LAB — PROTIME-INR
INR: 1.33
Prothrombin Time: 16.7 seconds — ABNORMAL HIGH (ref 11.4–15.0)

## 2015-02-04 LAB — CBC
HCT: 28.7 % — ABNORMAL LOW (ref 35.0–47.0)
HEMOGLOBIN: 9.2 g/dL — AB (ref 12.0–16.0)
MCH: 26 pg (ref 26.0–34.0)
MCHC: 32 g/dL (ref 32.0–36.0)
MCV: 81.2 fL (ref 80.0–100.0)
PLATELETS: 65 10*3/uL — AB (ref 150–440)
RBC: 3.53 MIL/uL — ABNORMAL LOW (ref 3.80–5.20)
RDW: 15.4 % — ABNORMAL HIGH (ref 11.5–14.5)
WBC: 2.3 10*3/uL — AB (ref 3.6–11.0)

## 2015-02-04 SURGERY — EGD (ESOPHAGOGASTRODUODENOSCOPY)
Anesthesia: General

## 2015-02-04 MED ORDER — LACTATED RINGERS IV SOLN
INTRAVENOUS | Status: DC
  Administered 2015-02-04: 09:00:00 via INTRAVENOUS
  Administered 2015-02-04: 1000 mL via INTRAVENOUS

## 2015-02-04 MED ORDER — FENTANYL CITRATE (PF) 100 MCG/2ML IJ SOLN
INTRAMUSCULAR | Status: DC | PRN
  Administered 2015-02-04: 50 ug via INTRAVENOUS

## 2015-02-04 MED ORDER — SODIUM CHLORIDE 0.9 % IV SOLN: INTRAVENOUS | Status: DC

## 2015-02-04 MED ORDER — PROPOFOL 10 MG/ML IV BOLUS
INTRAVENOUS | Status: DC | PRN
  Administered 2015-02-04 (×2): 70 mg via INTRAVENOUS

## 2015-02-04 MED ORDER — PROPOFOL INFUSION 10 MG/ML OPTIME
INTRAVENOUS | Status: DC | PRN
  Administered 2015-02-04: 120 ug/kg/min via INTRAVENOUS

## 2015-02-04 MED ORDER — LIDOCAINE HCL (CARDIAC) 20 MG/ML IV SOLN
INTRAVENOUS | Status: DC | PRN
  Administered 2015-02-04: 80 mg via INTRAVENOUS

## 2015-02-04 NOTE — Op Note (Signed)
[facility] Patient Name: Holly Evans Procedure Date: 02/04/2015 8:50 AM MRN: [id number] Account #: [id number] Date of Birth: 04/28/1956 Admit Type: Outpatient Age: 59 Room: [facility] ENDO ROOM 1 Gender: Female Note Status: Finalized Procedure:         Upper GI endoscopy Indications:       Evaluation of patient cirrrhosis for esophageal varices Providers:         Scot Jun, MD Referring MD:      Haynes Kerns (Referring MD) Medicines:         Propofol per Anesthesia Complications:     No immediate complications. Procedure:         Pre-Anesthesia Assessment:                    - After reviewing the risks and benefits, the patient was                     deemed in satisfactory condition to undergo the procedure.                    After obtaining informed consent, the endoscope was passed                     under direct vision. Throughout the procedure, the                     patient's blood pressure, pulse, and oxygen saturations                     were monitored continuously. The Endoscope was introduced                     through the mouth, and advanced to the second part of                     duodenum. The upper GI endoscopy was accomplished without                     difficulty. The patient tolerated the procedure well. Findings:      A mild Schatzki ring (acquired) was found at the gastroesophageal       junction. No esophageal varices were seen. GEJ at 35cm.      Diffuse moderate inflammation characterized by congestion (edema),       erythema and granularity was found in the entire examined stomach.       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      The examined duodenum was normal. Impression:        - Mild Schatzki ring.                    - Gastritis.                    - Normal examined duodenum.                    - No specimens collected. Recommendation:    Take medicine and  decrease alcohol consumption.                    - The findings and recommendations were discussed with the                     patient's family.  Scot Jun, MD 02/04/2015 9:11:15 AM This report has been signed electronically. Number of Addenda: 0 Note Initiated On: 02/04/2015 8:50 AM      [facility]

## 2015-02-04 NOTE — H&P (Signed)
   Primary Care Physician:  Sula Rumple, MD Primary Gastroenterologist:  Dr. Mechele Collin  Pre-Procedure History & Physical: HPI:  Holly Evans is a 59 y.o. female is here for an endoscopy.   Past Medical History  Diagnosis Date  . Depression   . IBS (irritable bowel syndrome)   . Endometriosis   . COPD (chronic obstructive pulmonary disease)   . Anxiety   . GERD (gastroesophageal reflux disease)   . Anemia     Iron Deficient  . Alcoholic cirrhosis of liver without ascites   . GI bleed     Past Surgical History  Procedure Laterality Date  . Dilation and curettage of uterus    . Tonsillectomy    . Tubal ligation    . Augmentation mammapolasty and abdominoplasty    . Laparoscopic lysis of adhesions      Prior to Admission medications   Medication Sig Start Date End Date Taking? Authorizing Provider  Fluticasone-Salmeterol (ADVAIR) 100-50 MCG/DOSE AEPB Inhale 1 puff into the lungs 2 (two) times daily.   Yes Historical Provider, MD  magnesium oxide (MAG-OX) 400 MG tablet Take 400 mg by mouth daily.   Yes Historical Provider, MD  naltrexone (DEPADE) 50 MG tablet Take 50 mg by mouth daily.   Yes Historical Provider, MD  saccharomyces boulardii (FLORASTOR) 250 MG capsule Take 250 mg by mouth daily.   Yes Historical Provider, MD  FLUoxetine (PROZAC) 20 MG capsule  01/25/15   Historical Provider, MD  omeprazole (PRILOSEC) 20 MG capsule  01/25/15   Historical Provider, MD  PROAIR HFA 108 (90 BASE) MCG/ACT inhaler  01/25/15   Historical Provider, MD    Allergies as of 01/18/2015  . (Not on File)    History reviewed. No pertinent family history.  History   Social History  . Marital Status: Married    Spouse Name: N/A  . Number of Children: N/A  . Years of Education: N/A   Occupational History  . Not on file.   Social History Main Topics  . Smoking status: Former Smoker    Quit date: 07/27/2012  . Smokeless tobacco: Never Used  . Alcohol Use: 12.6 - 16.8 oz/week    21-28 Shots of liquor per week  . Drug Use: No  . Sexual Activity: Not on file   Other Topics Concern  . Not on file   Social History Narrative    Review of Systems: See HPI, otherwise negative ROS  Physical Exam: BP 130/66 mmHg  Pulse 84  Temp(Src) 97.7 F (36.5 C)  Resp 16  Ht 5\' 11"  (1.803 m)  Wt 68.947 kg (152 lb)  BMI 21.21 kg/m2  SpO2 97% General:   Alert,  pleasant and cooperative in NAD Head:  Normocephalic and atraumatic. Neck:  Supple; no masses or thyromegaly. Lungs:  Clear throughout to auscultation.    Heart:  Regular rate and rhythm. Abdomen:  Soft, nontender and nondistended. Normal bowel sounds, without guarding, and without rebound.  Questionable palpable spleen and liver. Neurologic:  Alert and  oriented x4;  grossly normal neurologically.  LABS: hgb and WBC and platelets all low but acceptable for this procedure.  Impression/Plan: Holly Evans is here for an endoscopy to be performed for upper endoscopy to check for esophageal varices.  Risks, benefits, limitations, and alternatives regarding  endoscopy have been reviewed with the patient.  Questions have been answered.  All parties agreeable.   Lynnae Prude, MD  02/04/2015, 8:49 AM

## 2015-02-04 NOTE — Transfer of Care (Signed)
Immediate Anesthesia Transfer of Care Note  Patient: Holly Evans  Procedure(s) Performed: Procedure(s): ESOPHAGOGASTRODUODENOSCOPY (EGD) (N/A)  Patient Location: PACU and Endoscopy Unit  Anesthesia Type:General  Level of Consciousness: awake  Airway & Oxygen Therapy: Patient Spontanous Breathing  Post-op Assessment: Report given to RN  Post vital signs: stable  Last Vitals:  Filed Vitals:   02/04/15 0718  BP: 130/66  Pulse: 84  Temp: 36.5 C  Resp: 16   Past Medical History  Diagnosis Date  . Depression   . IBS (irritable bowel syndrome)   . Endometriosis   . COPD (chronic obstructive pulmonary disease)   . Anxiety   . GERD (gastroesophageal reflux disease)   . Anemia     Iron Deficient  . Alcoholic cirrhosis of liver without ascites   . GI bleed    Past Surgical History  Procedure Laterality Date  . Dilation and curettage of uterus    . Tonsillectomy    . Tubal ligation    . Augmentation mammapolasty and abdominoplasty    . Laparoscopic lysis of adhesions      Complications: No apparent anesthesia complications

## 2015-02-04 NOTE — Anesthesia Preprocedure Evaluation (Addendum)
Anesthesia Evaluation  Patient identified by MRN, date of birth, ID band Patient awake    Reviewed: Allergy & Precautions, NPO status , Patient's Chart, lab work & pertinent test results  Airway Mallampati: II  TM Distance: >3 FB Neck ROM: Full    Dental  (+) Caps   Pulmonary COPD COPD inhaler, former smoker,    Pulmonary exam normal       Cardiovascular negative cardio ROS Normal cardiovascular exam    Neuro/Psych Anxiety Depression    GI/Hepatic GERD-  Medicated and Controlled,(+) Cirrhosis -      , dysphagia   Endo/Other  negative endocrine ROS  Renal/GU negative Renal ROS  negative genitourinary   Musculoskeletal negative musculoskeletal ROS (+)   Abdominal Normal abdominal exam  (+)   Peds negative pediatric ROS (+)  Hematology  (+) anemia , Thrombocytopenia to 65K   Anesthesia Other Findings   Reproductive/Obstetrics                            Anesthesia Physical Anesthesia Plan  ASA: III  Anesthesia Plan: General   Post-op Pain Management:    Induction: Intravenous  Airway Management Planned: Nasal Cannula  Additional Equipment:   Intra-op Plan:   Post-operative Plan:   Informed Consent: I have reviewed the patients History and Physical, chart, labs and discussed the procedure including the risks, benefits and alternatives for the proposed anesthesia with the patient or authorized representative who has indicated his/her understanding and acceptance.   Dental advisory given  Plan Discussed with: CRNA and Surgeon  Anesthesia Plan Comments: (Pt can eat eggs in cakes without problems)        Anesthesia Quick Evaluation

## 2015-02-04 NOTE — Anesthesia Postprocedure Evaluation (Signed)
  Anesthesia Post-op Note  Patient: Holly Evans  Procedure(s) Performed: Procedure(s): ESOPHAGOGASTRODUODENOSCOPY (EGD) (N/A)  Anesthesia type:General  Patient location: PACU  Post pain: Pain level controlled  Post assessment: Post-op Vital signs reviewed, Patient's Cardiovascular Status Stable, Respiratory Function Stable, Patent Airway and No signs of Nausea or vomiting  Post vital signs: Reviewed and stable  Last Vitals:  Filed Vitals:   02/04/15 1000  BP: 127/79  Pulse: 80  Temp:   Resp: 14    Level of consciousness: awake, alert  and patient cooperative  Complications: No apparent anesthesia complications

## 2015-02-09 ENCOUNTER — Encounter: Payer: Self-pay | Admitting: Unknown Physician Specialty

## 2015-02-26 NOTE — Progress Notes (Signed)
[facility]  Telephone:([phone]) [phone] [phone]  ID: Holly Evans OB: November 17, 1955  MR#: [id number]  AOZ#:[id number]  Patient Care Team: Sula Rumple, MD as PCP - General (Family Medicine)  CHIEF COMPLAINT:  Chief Complaint  Patient presents with  . Follow-up    Pancytopenia    INTERVAL HISTORY: Patient returns to clinic today for further evaluation and laboratory work. She currently feels well and is asymptomatic. She has no neurologic complaints. She has a good appetite and denies weight loss. She denies any chest pain or shortness of breath. She has no urinary complaints. Patient offers no specific complaints today.  REVIEW OF SYSTEMS:   Review of Systems  Constitutional: Negative.   Respiratory: Negative.   Cardiovascular: Negative.   Genitourinary: Negative.   Endo/Heme/Allergies: Does not bruise/bleed easily.    As per HPI. Otherwise, a complete review of systems is negatve.  PAST MEDICAL HISTORY: Past Medical History  Diagnosis Date  . Depression   . IBS (irritable bowel syndrome)   . Endometriosis   . COPD (chronic obstructive pulmonary disease)   . Anxiety   . GERD (gastroesophageal reflux disease)   . Anemia     Iron Deficient  . Alcoholic cirrhosis of liver without ascites   . GI bleed     PAST SURGICAL HISTORY: Past Surgical History  Procedure Laterality Date  . Dilation and curettage of uterus    . Tonsillectomy    . Tubal ligation    . Augmentation mammapolasty and abdominoplasty    . Laparoscopic lysis of adhesions    . Esophagogastroduodenoscopy N/A 02/04/2015    Procedure: ESOPHAGOGASTRODUODENOSCOPY (EGD);  Surgeon: Scot Jun, MD;  Location: [facility] ENDOSCOPY;  Service: Endoscopy;  Laterality: N/A;    FAMILY HISTORY:  Father "sudden death" from unknown causes in his 30s. No other pertinent family history.      ADVANCED DIRECTIVES:    HEALTH MAINTENANCE: History  Substance Use Topics  . Smoking  status: Former Smoker    Quit date: 07/27/2012  . Smokeless tobacco: Never Used  . Alcohol Use: 12.6 - 16.8 oz/week    21-28 Shots of liquor per week     Colonoscopy:  PAP:  Bone density:  Lipid panel:  Allergies  Allergen Reactions  . Beef-Derived Products Anaphylaxis  . Banana Nausea Only  . Eggs Or Egg-Derived Products Nausea Only  . Lactase Nausea Only  . Sulfa Antibiotics Diarrhea    Current Outpatient Prescriptions  Medication Sig Dispense Refill  . FLUoxetine (PROZAC) 20 MG capsule   10  . omeprazole (PRILOSEC) 20 MG capsule   7  . PROAIR HFA 108 (90 BASE) MCG/ACT inhaler   0  . Fluticasone-Salmeterol (ADVAIR) 100-50 MCG/DOSE AEPB Inhale 1 puff into the lungs 2 (two) times daily.    . magnesium oxide (MAG-OX) 400 MG tablet Take 400 mg by mouth daily.    . naltrexone (DEPADE) 50 MG tablet Take 50 mg by mouth daily.    Marland Kitchen saccharomyces boulardii (FLORASTOR) 250 MG capsule Take 250 mg by mouth daily.     No current facility-administered medications for this visit.    OBJECTIVE: Filed Vitals:   01/28/15 1007  BP: 140/65  Pulse: 84  Temp: 97.1 F (36.2 C)  Resp: 18     There is no height on file to calculate BMI.    ECOG FS:0 - Asymptomatic  General: Well-developed, well-nourished, no acute distress. Eyes: anicteric sclera. Lungs: Clear to auscultation bilaterally. Heart: Regular rate and rhythm. No rubs,  murmurs, or gallops. Abdomen: Soft, nontender, nondistended. No organomegaly noted, normoactive bowel sounds. Musculoskeletal: No edema, cyanosis, or clubbing. Neuro: Alert, answering all questions appropriately. Cranial nerves grossly intact. Skin: No rashes or petechiae noted. Psych: Normal affect.   LAB RESULTS:  Lab Results  Component Value Date   NA 141 04/20/2014   K 3.5 04/20/2014   CL 107 04/20/2014   CO2 24 04/20/2014   GLUCOSE 124* 04/20/2014   BUN 7 04/20/2014   CREATININE 0.69 04/20/2014   CALCIUM 9.0 04/20/2014   PROT 7.6 04/20/2014    ALBUMIN 3.8 04/20/2014   AST 152* 04/20/2014   ALT 99* 04/20/2014   ALKPHOS 155* 04/20/2014   GFRNONAA >60 04/20/2014   GFRAA >60 04/20/2014    Lab Results  Component Value Date   WBC 2.3* 02/04/2015   NEUTROABS 0.9* 01/28/2015   HGB 9.2* 02/04/2015   HCT 28.7* 02/04/2015   MCV 81.2 02/04/2015   PLT 65* 02/04/2015     STUDIES: No results found.  ASSESSMENT: Pancytopenia, likely secondary to alcoholism and splenomegaly  PLAN:    1. Pancytopenia:  Likely multifactorial given patient's heavy alcoholism and "marked" splenomegaly. Patient also has a history of heme positive stools. She was evaluated by gastroenterology last week and has an upper endoscopy scheduled on Friday, February 04, 2015. She does not require additional IV iron at this time. Previously, the remainder of her laboratory work was either negative or within normal limits. Return to clinic in 3 months with laboratory work and then in 6 months with laboratory work and further evaluation. This appointment may change based on the results of her EGD.   Patient expressed understanding and was in agreement with this plan. She also understands that She can call clinic at any time with any questions, concerns, or complaints.    Jeralyn Ruthsimothy J Kieran Nachtigal, MD   02/26/2015 12:30 PM

## 2015-04-29 ENCOUNTER — Other Ambulatory Visit: Payer: BLUE CROSS/BLUE SHIELD

## 2015-08-05 ENCOUNTER — Inpatient Hospital Stay: Payer: BLUE CROSS/BLUE SHIELD | Admitting: Oncology

## 2015-08-05 ENCOUNTER — Inpatient Hospital Stay: Payer: BLUE CROSS/BLUE SHIELD

## 2015-08-19 ENCOUNTER — Other Ambulatory Visit: Payer: BLUE CROSS/BLUE SHIELD

## 2015-08-19 ENCOUNTER — Ambulatory Visit: Payer: BLUE CROSS/BLUE SHIELD | Admitting: Oncology

## 2015-09-09 ENCOUNTER — Other Ambulatory Visit: Payer: BLUE CROSS/BLUE SHIELD

## 2015-09-09 ENCOUNTER — Ambulatory Visit: Payer: BLUE CROSS/BLUE SHIELD | Admitting: Oncology

## 2015-09-30 ENCOUNTER — Emergency Department: Payer: 59

## 2015-09-30 ENCOUNTER — Inpatient Hospital Stay
Admission: EM | Admit: 2015-09-30 | Discharge: 2015-10-03 | DRG: 433 | Disposition: A | Discharge status: Home / Self Care | Payer: 59 | Attending: Internal Medicine | Admitting: Internal Medicine

## 2015-09-30 ENCOUNTER — Encounter: Payer: Self-pay | Admitting: Internal Medicine

## 2015-09-30 DIAGNOSIS — J449 Chronic obstructive pulmonary disease, unspecified: Secondary | ICD-10-CM | POA: Diagnosis present

## 2015-09-30 DIAGNOSIS — Z9851 Tubal ligation status: Secondary | ICD-10-CM | POA: Diagnosis not present

## 2015-09-30 DIAGNOSIS — Z79899 Other long term (current) drug therapy: Secondary | ICD-10-CM | POA: Diagnosis not present

## 2015-09-30 DIAGNOSIS — Z9889 Other specified postprocedural states: Secondary | ICD-10-CM

## 2015-09-30 DIAGNOSIS — F1020 Alcohol dependence, uncomplicated: Secondary | ICD-10-CM | POA: Diagnosis present

## 2015-09-30 DIAGNOSIS — K219 Gastro-esophageal reflux disease without esophagitis: Secondary | ICD-10-CM | POA: Diagnosis present

## 2015-09-30 DIAGNOSIS — Z823 Family history of stroke: Secondary | ICD-10-CM

## 2015-09-30 DIAGNOSIS — Z87891 Personal history of nicotine dependence: Secondary | ICD-10-CM | POA: Diagnosis not present

## 2015-09-30 DIAGNOSIS — R140 Abdominal distension (gaseous): Secondary | ICD-10-CM

## 2015-09-30 DIAGNOSIS — Z882 Allergy status to sulfonamides status: Secondary | ICD-10-CM

## 2015-09-30 DIAGNOSIS — D649 Anemia, unspecified: Secondary | ICD-10-CM | POA: Diagnosis present

## 2015-09-30 DIAGNOSIS — K7031 Alcoholic cirrhosis of liver with ascites: Secondary | ICD-10-CM | POA: Diagnosis present

## 2015-09-30 DIAGNOSIS — Z888 Allergy status to other drugs, medicaments and biological substances status: Secondary | ICD-10-CM

## 2015-09-30 DIAGNOSIS — E876 Hypokalemia: Secondary | ICD-10-CM | POA: Diagnosis present

## 2015-09-30 DIAGNOSIS — K766 Portal hypertension: Secondary | ICD-10-CM | POA: Diagnosis present

## 2015-09-30 DIAGNOSIS — E871 Hypo-osmolality and hyponatremia: Secondary | ICD-10-CM | POA: Diagnosis present

## 2015-09-30 DIAGNOSIS — F329 Major depressive disorder, single episode, unspecified: Secondary | ICD-10-CM | POA: Diagnosis present

## 2015-09-30 DIAGNOSIS — D6959 Other secondary thrombocytopenia: Secondary | ICD-10-CM | POA: Diagnosis present

## 2015-09-30 DIAGNOSIS — R188 Other ascites: Secondary | ICD-10-CM | POA: Diagnosis present

## 2015-09-30 LAB — COMPREHENSIVE METABOLIC PANEL
ALT: 36 U/L (ref 14–54)
ANION GAP: 8 (ref 5–15)
AST: 126 U/L — ABNORMAL HIGH (ref 15–41)
Albumin: 2.8 g/dL — ABNORMAL LOW (ref 3.5–5.0)
Alkaline Phosphatase: 116 U/L (ref 38–126)
BUN: 10 mg/dL (ref 6–20)
CO2: 22 mmol/L (ref 22–32)
Calcium: 8.3 mg/dL — ABNORMAL LOW (ref 8.9–10.3)
Chloride: 104 mmol/L (ref 101–111)
Creatinine, Ser: 0.64 mg/dL (ref 0.44–1.00)
GFR calc non Af Amer: >60 mL/min (ref >60)
Glucose, Bld: 114 mg/dL — ABNORMAL HIGH (ref 65–99)
Potassium: 2.9 mmol/L — CL (ref 3.5–5.1)
SODIUM: 134 mmol/L — AB (ref 135–145)
Total Bilirubin: 4.2 mg/dL — ABNORMAL HIGH (ref 0.3–1.2)
Total Protein: 5.9 g/dL — ABNORMAL LOW (ref 6.5–8.1)

## 2015-09-30 LAB — CBC
HCT: 28.5 % — ABNORMAL LOW (ref 35.0–47.0)
HEMOGLOBIN: 8.9 g/dL — AB (ref 12.0–16.0)
MCH: 24.4 pg — AB (ref 26.0–34.0)
MCHC: 31.3 g/dL — ABNORMAL LOW (ref 32.0–36.0)
MCV: 78.1 fL — AB (ref 80.0–100.0)
Platelets: 100 10*3/uL — ABNORMAL LOW (ref 150–440)
RBC: 3.65 MIL/uL — AB (ref 3.80–5.20)
RDW: 20.3 % — ABNORMAL HIGH (ref 11.5–14.5)
WBC: 5.9 10*3/uL (ref 3.6–11.0)

## 2015-09-30 LAB — MAGNESIUM: MAGNESIUM: 1.8 mg/dL (ref 1.7–2.4)

## 2015-09-30 LAB — PROTIME-INR
INR: 1.77
Prothrombin Time: 20.6 seconds — ABNORMAL HIGH (ref 11.4–15.0)

## 2015-09-30 MED ORDER — SODIUM CHLORIDE 0.9 % IV SOLN: 250.0000 mL | INTRAVENOUS | Status: DC | PRN

## 2015-09-30 MED ORDER — LORAZEPAM 2 MG/ML IJ SOLN: 1.0000 mg | Freq: Four times a day (QID) | INTRAMUSCULAR | Status: DC | PRN

## 2015-09-30 MED ORDER — POTASSIUM CHLORIDE CRYS ER 20 MEQ PO TBCR
20.0000 meq | EXTENDED_RELEASE_TABLET | Freq: Every day | ORAL | Status: DC
  Administered 2015-10-01 – 2015-10-03 (×3): 20 meq via ORAL
  Filled 2015-09-30 (×3): qty 1

## 2015-09-30 MED ORDER — ALBUTEROL SULFATE (2.5 MG/3ML) 0.083% IN NEBU: 2.5000 mg | INHALATION_SOLUTION | RESPIRATORY_TRACT | Status: DC | PRN

## 2015-09-30 MED ORDER — FUROSEMIDE 10 MG/ML IJ SOLN
20.0000 mg | Freq: Two times a day (BID) | INTRAMUSCULAR | Status: DC
  Administered 2015-09-30 – 2015-10-03 (×6): 20 mg via INTRAVENOUS
  Filled 2015-09-30 (×6): qty 2

## 2015-09-30 MED ORDER — SODIUM CHLORIDE 0.9% FLUSH: 3.0000 mL | INTRAVENOUS | Status: DC | PRN

## 2015-09-30 MED ORDER — THIAMINE HCL 100 MG/ML IJ SOLN: 100.0000 mg | Freq: Every day | INTRAMUSCULAR | Status: DC

## 2015-09-30 MED ORDER — ACETAMINOPHEN 325 MG PO TABS: 650.0000 mg | ORAL_TABLET | Freq: Four times a day (QID) | ORAL | Status: DC | PRN

## 2015-09-30 MED ORDER — MOMETASONE FURO-FORMOTEROL FUM 100-5 MCG/ACT IN AERO
2.0000 | INHALATION_SPRAY | Freq: Two times a day (BID) | RESPIRATORY_TRACT | Status: DC
  Administered 2015-09-30 – 2015-10-03 (×6): 2 via RESPIRATORY_TRACT
  Filled 2015-09-30: qty 8.8

## 2015-09-30 MED ORDER — HEPARIN SODIUM (PORCINE) 5000 UNIT/ML IJ SOLN
5000.0000 [IU] | Freq: Three times a day (TID) | INTRAMUSCULAR | Status: DC
  Administered 2015-09-30 – 2015-10-03 (×7): 5000 [IU] via SUBCUTANEOUS
  Filled 2015-09-30 (×7): qty 1

## 2015-09-30 MED ORDER — ACETAMINOPHEN 650 MG RE SUPP: 650.0000 mg | Freq: Four times a day (QID) | RECTAL | Status: DC | PRN

## 2015-09-30 MED ORDER — POTASSIUM CHLORIDE CRYS ER 20 MEQ PO TBCR
60.0000 meq | EXTENDED_RELEASE_TABLET | Freq: Once | ORAL | Status: AC
  Administered 2015-09-30: 23:00:00 60 meq via ORAL
  Filled 2015-09-30: qty 3

## 2015-09-30 MED ORDER — LORAZEPAM 1 MG PO TABS: 1.0000 mg | ORAL_TABLET | Freq: Four times a day (QID) | ORAL | Status: DC | PRN

## 2015-09-30 MED ORDER — VITAMIN B-1 100 MG PO TABS
100.0000 mg | ORAL_TABLET | Freq: Every day | ORAL | Status: DC
  Administered 2015-09-30 – 2015-10-03 (×4): 100 mg via ORAL
  Filled 2015-09-30 (×4): qty 1

## 2015-09-30 MED ORDER — ONDANSETRON HCL 4 MG/2ML IJ SOLN
4.0000 mg | Freq: Once | INTRAMUSCULAR | Status: AC
  Administered 2015-09-30: 4 mg via INTRAVENOUS
  Filled 2015-09-30: qty 2

## 2015-09-30 MED ORDER — ADULT MULTIVITAMIN W/MINERALS CH
1.0000 | ORAL_TABLET | Freq: Every day | ORAL | Status: DC
  Administered 2015-09-30 – 2015-10-03 (×4): 1 via ORAL
  Filled 2015-09-30 (×4): qty 1

## 2015-09-30 MED ORDER — FLUOXETINE HCL 20 MG PO CAPS
20.0000 mg | ORAL_CAPSULE | Freq: Every day | ORAL | Status: DC
  Administered 2015-09-30 – 2015-10-03 (×4): 20 mg via ORAL
  Filled 2015-09-30 (×4): qty 1

## 2015-09-30 MED ORDER — SODIUM CHLORIDE 0.9% FLUSH
3.0000 mL | Freq: Two times a day (BID) | INTRAVENOUS | Status: DC
  Administered 2015-09-30 – 2015-10-03 (×6): 3 mL via INTRAVENOUS

## 2015-09-30 MED ORDER — MORPHINE SULFATE (PF) 4 MG/ML IV SOLN
4.0000 mg | Freq: Once | INTRAVENOUS | Status: AC
  Administered 2015-09-30: 4 mg via INTRAVENOUS
  Filled 2015-09-30: qty 1

## 2015-09-30 MED ORDER — ONDANSETRON HCL 4 MG PO TABS: 4.0000 mg | ORAL_TABLET | Freq: Four times a day (QID) | ORAL | Status: DC | PRN

## 2015-09-30 MED ORDER — ONDANSETRON HCL 4 MG/2ML IJ SOLN: 4.0000 mg | Freq: Four times a day (QID) | INTRAMUSCULAR | Status: DC | PRN

## 2015-09-30 MED ORDER — FOLIC ACID 1 MG PO TABS
1.0000 mg | ORAL_TABLET | Freq: Every day | ORAL | Status: DC
Start: 2015-09-30 — End: 2015-10-03
  Administered 2015-09-30 – 2015-10-03 (×4): 1 mg via ORAL
  Filled 2015-09-30 (×4): qty 1

## 2015-09-30 MED ORDER — IBUPROFEN 600 MG PO TABS: 600.0000 mg | ORAL_TABLET | Freq: Four times a day (QID) | ORAL | Status: DC | PRN

## 2015-09-30 MED ORDER — PANTOPRAZOLE SODIUM 40 MG PO TBEC
40.0000 mg | DELAYED_RELEASE_TABLET | Freq: Every day | ORAL | Status: DC
  Administered 2015-09-30 – 2015-10-03 (×4): 40 mg via ORAL
  Filled 2015-09-30 (×4): qty 1

## 2015-09-30 NOTE — ED Notes (Signed)
Notified EDP of critical potassium.

## 2015-09-30 NOTE — H&P (Addendum)
[facility] [place] [facility]   PATIENT NAME: Holly Evans    MR#:  [id number]  DATE OF BIRTH:  10-04-1955  DATE OF ADMISSION:  09/30/2015  PRIMARY CARE PHYSICIAN: Sula Rumple, MD   REQUESTING/REFERRING PHYSICIAN: Minna Antis, MD  CHIEF COMPLAINT:   Chief Complaint  Patient presents with  . Abdominal Pain  abdominal pain and distention for one week.  HISTORY OF PRESENT ILLNESS:  Holly Evans  is a 60 y.o. female with a known history of liver cirrhosis, COPD, GI bleeding and alcohol ABUSE. The patient has had abdominal pain and worsening distention for the past one to 2 weeks. She see her primary care physician, who sent her to ED for further evaluation. She denies any fever or chills, no cough, sputum or leg swelling. She has shortness of breath due to abdominal distension. He has been drinking liquor for many years but quit 12 days ago. She said that she had DT one week ago but denies any tremor or shaking for the past one week.  PAST MEDICAL HISTORY:   Past Medical History  Diagnosis Date  . Depression   . IBS (irritable bowel syndrome)   . Endometriosis   . COPD (chronic obstructive pulmonary disease) (HCC)   . Anxiety   . GERD (gastroesophageal reflux disease)   . Anemia     Iron Deficient  . Alcoholic cirrhosis of liver without ascites (HCC)   . GI bleed     PAST SURGICAL HISTORY:   Past Surgical History  Procedure Laterality Date  . Dilation and curettage of uterus    . Tonsillectomy    . Tubal ligation    . Augmentation mammapolasty and abdominoplasty    . Laparoscopic lysis of adhesions    . Esophagogastroduodenoscopy N/A 02/04/2015    Procedure: ESOPHAGOGASTRODUODENOSCOPY (EGD);  Surgeon: Scot Jun, MD;  Location: [facility] ENDOSCOPY;  Service: Endoscopy;  Laterality: N/A;    SOCIAL HISTORY:   Social History  Substance Use Topics  . Smoking status: Former Smoker    Quit date: 07/27/2012  . Smokeless tobacco:  Never Used  . Alcohol Use: 12.6 - 16.8 oz/week    21-28 Shots of liquor per week    FAMILY HISTORY:   Family History  Problem Relation Age of Onset  . Gallbladder disease Mother   . Stroke Brother     DRUG ALLERGIES:   Allergies  Allergen Reactions  . Beef-Derived Products Anaphylaxis  . Banana Nausea And Vomiting  . Eggs Or Egg-Derived Products Nausea And Vomiting  . Lactase Nausea And Vomiting  . Sulfa Antibiotics Diarrhea    REVIEW OF SYSTEMS:  CONSTITUTIONAL: No fever, fatigue or weakness.  EYES: No blurred or double vision.  EARS, NOSE, AND THROAT: No tinnitus or ear pain.  RESPIRATORY: No cough,  Has shortness of breath, no wheezing or hemoptysis.  CARDIOVASCULAR: No chest pain, orthopnea, edema.  GASTROINTESTINAL: No nausea, vomiting, diarrhea but has abdominal pain and distension. No melena or bloody stool. GENITOURINARY: No dysuria, hematuria.  ENDOCRINE: No polyuria, nocturia,  HEMATOLOGY: No anemia, easy bruising or bleeding SKIN: No rash or lesion. MUSCULOSKELETAL: No joint pain or arthritis.   NEUROLOGIC: No tingling, numbness, weakness.  PSYCHIATRY: No anxiety or depression.   MEDICATIONS AT HOME:   Prior to Admission medications   Medication Sig Start Date End Date Taking? Authorizing Provider  FLUoxetine (PROZAC) 20 MG capsule Take 20 mg by mouth daily.    Yes Historical Provider, MD  Fluticasone-Salmeterol (ADVAIR) 100-50  MCG/DOSE AEPB Inhale 1 puff into the lungs 2 (two) times daily.   Yes Historical Provider, MD  Multiple Vitamin (MULTIVITAMIN WITH MINERALS) TABS tablet Take 1 tablet by mouth daily.   Yes Historical Provider, MD  omeprazole (PRILOSEC) 20 MG capsule Take 20 mg by mouth daily.    Yes Historical Provider, MD      VITAL SIGNS:  Blood pressure 135/66, pulse 95, temperature 98.3 F (36.8 C), temperature source Oral, resp. rate 18, height  (1.778 m), weight 74.39 kg (164 lb), SpO2 98 %.  PHYSICAL EXAMINATION:  GENERAL:  60  y.o.-year-old patient lying in the bed with no acute distress.  EYES: Pupils equal, round, reactive to light and accommodation. No scleral icterus. Extraocular muscles intact.  HEENT: Head atraumatic, normocephalic. Oropharynx and nasopharynx clear.  NECK:  Supple, no jugular venous distention. No thyroid enlargement, no tenderness.  LUNGS: Normal breath sounds bilaterally, no wheezing, rales,rhonchi or crepitation. No use of accessory muscles of respiration.  CARDIOVASCULAR: S1, S2 normal. No murmurs, rubs, or gallops.  ABDOMEN: Soft, nontender, highly distended. Bowel sounds present. Positive ascites sign, unable to estmiate organomegaly or mass.  EXTREMITIES: No pedal edema, cyanosis, or clubbing.  NEUROLOGIC: Cranial nerves II through XII are intact. Muscle strength 5/5 in all extremities. Sensation intact. Gait not checked.  PSYCHIATRIC: The patient is alert and oriented x 3.  SKIN: No obvious rash, lesion, or ulcer.   LABORATORY PANEL:   CBC  Recent Labs Lab 09/30/15 1705  WBC 5.9  HGB 8.9*  HCT 28.5*  PLT 100*   ------------------------------------------------------------------------------------------------------------------  Chemistries   Recent Labs Lab 09/30/15 1705  NA 134*  K 2.9*  CL 104  CO2 22  GLUCOSE 114*  BUN 10  CREATININE 0.64  CALCIUM 8.3*  AST 126*  ALT 36  ALKPHOS 116  BILITOT 4.2*   ------------------------------------------------------------------------------------------------------------------  Cardiac Enzymes No results for input(s): TROPONINI in the last 168 hours. ------------------------------------------------------------------------------------------------------------------  RADIOLOGY:  Dg Chest Portable 1 View  09/30/2015  CLINICAL DATA:  Difficulty breathing, abdominal pain and abdominal distention. Cirrhosis. EXAM: PORTABLE CHEST 1 VIEW COMPARISON:  11/11/2014 FINDINGS: Normal mediastinum and cardiac silhouette. Normal pulmonary  vasculature. No evidence of effusion, infiltrate, or pneumothorax. No acute bony abnormality. IMPRESSION: No acute cardiopulmonary process. Electronically Signed   By: Genevive Bi M.D.   On: 09/30/2015 19:36    EKG:   Orders placed or performed in visit on 04/20/14  . EKG 12-Lead    IMPRESSION AND PLAN:   Liver cirrhosis with Ascites The patient will be admitted to medical floor. I will start Lasix IV twice a day with potassium supplement. Get the ultrasound guided paracentesis and GI consult.  Hypokalemia. I'll give potassium supplement, check magnesium level and follow-up BMP.  Hyponatremia. Possible due to alcohol abuse and liver cirrhosis.  Thrombocytopenia. Possible due to  liver cirrhosis.   Alcohol abuse. Start CIWA protocol.  COPD. Stable. DuoNeb when necessary.     All the records are reviewed and case discussed with ED provider. Management plans discussed with the patient, her husband and they are in agreement.  CODE STATUS: full code  TOTAL TIME TAKING CARE OF THIS PATIENT: 56 minutes.    Shaune Pollack M.D on 09/30/2015 at 7:50 PM  Between 7am to 6pm - Pager - [phone]  After 6pm go to [email or web address] - password [email or web address] [facility]  [facility]  Office  [phone]  CC: Primary care physician; Sula Rumple, MD

## 2015-09-30 NOTE — ED Notes (Signed)
Pts blood drawn/iv initiated by first nurse, (this RN) pt in between rooms, no scanner available at time of draw, lab notified that blood is being sent with white lable intact, Stacey from lab states she will let appropriate people know.

## 2015-09-30 NOTE — ED Notes (Signed)
Patient presents to [facility] ED with complaint of abdominal pain. Patient left her PCP Elmer Ramp MD) this afternoon and was instructed to report to the ED for evaluation of enlarged liver. Patient states that md "wanted her liver drained". Last ETOH T-12d  +Constipation (non productive BM today) +Flatus -N/V

## 2015-09-30 NOTE — ED Notes (Signed)
Pt hungry, EDP okay to give sandwich tray.  Pt to be admitted to hospital.  Pt not wanting morphine until after she eats some.

## 2015-09-30 NOTE — ED Provider Notes (Signed)
[facility] Emergency Department Provider Note  Time seen: 6:52 PM  I have reviewed the triage vital signs and the nursing notes.   HISTORY  Chief Complaint Abdominal Pain    HPI Holly Evans is a 60 y.o. female with a past medical history of depression, COPD, anxiety, alcoholic cirrhosis who presents the emergency department with abdominal swelling abdominal pain and trouble breathing. According to the patient over the past 1-2 weeksshe has had significant abdominal distention. She was finally able to see her primary care doctor today who referred her to the emergency department given her large distended abdomen which is new for the patient. Patient states she has never had a distended abdomen in the past. Patient states chronic alcoholism but she quit approximately 12 days ago. Patient has never seen a hepatologist. Has never had a liver workup per patient. Denies fever. States some nausea but denies vomiting. Describes her abdominal pain as a pressure, distention sensation moderate in severity. Describes her shortness breath as mild, worse with exertion.     Past Medical History  Diagnosis Date  . Depression   . IBS (irritable bowel syndrome)   . Endometriosis   . COPD (chronic obstructive pulmonary disease)   . Anxiety   . GERD (gastroesophageal reflux disease)   . Anemia     Iron Deficient  . Alcoholic cirrhosis of liver without ascites   . GI bleed     Patient Active Problem List   Diagnosis Date Noted  . Other pancytopenia (HCC) 01/28/2015    Past Surgical History  Procedure Laterality Date  . Dilation and curettage of uterus    . Tonsillectomy    . Tubal ligation    . Augmentation mammapolasty and abdominoplasty    . Laparoscopic lysis of adhesions    . Esophagogastroduodenoscopy N/A 02/04/2015    Procedure: ESOPHAGOGASTRODUODENOSCOPY (EGD);  Surgeon: Scot Jun, MD;  Location: [facility] ENDOSCOPY;  Service: Endoscopy;  Laterality:  N/A;    Current Outpatient Rx  Name  Route  Sig  Dispense  Refill  . FLUoxetine (PROZAC) 20 MG capsule            10   . Fluticasone-Salmeterol (ADVAIR) 100-50 MCG/DOSE AEPB   Inhalation   Inhale 1 puff into the lungs 2 (two) times daily.         . magnesium oxide (MAG-OX) 400 MG tablet   Oral   Take 400 mg by mouth daily.         . naltrexone (DEPADE) 50 MG tablet   Oral   Take 50 mg by mouth daily.         Marland Kitchen omeprazole (PRILOSEC) 20 MG capsule            7   . PROAIR HFA 108 (90 BASE) MCG/ACT inhaler            0     Dispense as written.   . saccharomyces boulardii (FLORASTOR) 250 MG capsule   Oral   Take 250 mg by mouth daily.           Allergies Beef-derived products; Banana; Eggs or egg-derived products; Lactase; and Sulfa antibiotics  No family history on file.  Social History Social History  Substance Use Topics  . Smoking status: Former Smoker    Quit date: 07/27/2012  . Smokeless tobacco: Never Used  . Alcohol Use: 12.6 - 16.8 oz/week    21-28 Shots of liquor per week    Review of Systems Constitutional:  Negative for fever. Cardiovascular: Negative for chest pain. Respiratory: Mild shortness of breath Gastrointestinal: Moderate abdominal pain, diffuse, positive nausea. Denies vomiting or diarrhea. Genitourinary: Negative for dysuria. Neurological: Negative for headache 10-point ROS otherwise negative.  ____________________________________________   PHYSICAL EXAM:  VITAL SIGNS: ED Triage Vitals  Enc Vitals Group     BP 09/30/15 1722 150/66 mmHg     Pulse Rate 09/30/15 1722 98     Resp 09/30/15 1722 18     Temp 09/30/15 1722 98.3 F (36.8 C)     Temp Source 09/30/15 1722 Oral     SpO2 09/30/15 1722 98 %     Weight 09/30/15 1722 164 lb (74.39 kg)     Height 09/30/15 1722  (1.778 m)     Head Cir --      Peak Flow --      Pain Score 09/30/15 1723 3     Pain Loc --      Pain Edu? --      Excl. in GC? --      Constitutional: Alert and oriented. Well appearing and in no distress. Eyes: Scleral icterus. ENT   Head: Normocephalic and atraumatic   Mouth/Throat: Mucous membranes are moist. Cardiovascular: Normal rate, regular rhythm. No murmur Respiratory: Normal respiratory effort without tachypnea nor retractions. Breath sounds are clear Gastrointestinal: Significant abdominal distention. Mild diffuse tenderness to palpation. No rebound or guarding. Dull to percussion. Musculoskeletal: Nontender with normal range of motion in all extremities. No lower extremity edema or tenderness. Neurologic:  Normal speech and language. No gross focal neurologic deficits Skin:  Skin is warm, dry, mild jaundice appearance Psychiatric: Mood and affect are normal. Speech and behavior are normal.   ____________________________________________    INITIAL IMPRESSION / ASSESSMENT AND PLAN / ED COURSE  Pertinent labs & imaging results that were available during my care of the patient were reviewed by me and considered in my medical decision making (see chart for details).  The patient presents to the emergency department with abdominal distention and pain. Exam is very consistent with abdominal distention due to ascites accumulation. Patient has never had ascites previously. Bilirubin of 4.2 on labs, INR pending. We will calculated meld score once the INR has resulted. Given the patient's abdominal distention, shortness of breath, no history of ascites patient will likely need to be admitted to the hospital for liver failure workup and to have interventional radiology drain the patient's ascites.  INR 1.77, meld score 20 which equates to 6% 3 month mortality. Given the patient's abdominal distention, shortness of breath, new ascites accumulation we'll admit the patient to the hospital and arrange for therapeutic paracentesis.  ____________________________________________   FINAL CLINICAL IMPRESSION(S) / ED  DIAGNOSES  Abdominal pain and distention Ascites End-stage liver disease  Minna Antis, MD 09/30/15 [phone]

## 2015-10-01 ENCOUNTER — Inpatient Hospital Stay: Payer: 59

## 2015-10-01 LAB — BASIC METABOLIC PANEL
Anion gap: 4 — ABNORMAL LOW (ref 5–15)
BUN: 11 mg/dL (ref 6–20)
CHLORIDE: 109 mmol/L (ref 101–111)
CO2: 24 mmol/L (ref 22–32)
CREATININE: 0.66 mg/dL (ref 0.44–1.00)
Calcium: 8 mg/dL — ABNORMAL LOW (ref 8.9–10.3)
GFR calc Af Amer: >60 mL/min (ref >60)
GFR calc non Af Amer: >60 mL/min (ref >60)
Glucose, Bld: 98 mg/dL (ref 65–99)
Potassium: 3.7 mmol/L (ref 3.5–5.1)
SODIUM: 137 mmol/L (ref 135–145)

## 2015-10-01 LAB — CBC
HCT: 25.1 % — ABNORMAL LOW (ref 35.0–47.0)
Hemoglobin: 8 g/dL — ABNORMAL LOW (ref 12.0–16.0)
MCH: 24.6 pg — ABNORMAL LOW (ref 26.0–34.0)
MCHC: 32 g/dL (ref 32.0–36.0)
MCV: 77 fL — AB (ref 80.0–100.0)
PLATELETS: 82 10*3/uL — AB (ref 150–440)
RBC: 3.27 MIL/uL — ABNORMAL LOW (ref 3.80–5.20)
RDW: 20.7 % — AB (ref 11.5–14.5)
WBC: 4.1 10*3/uL (ref 3.6–11.0)

## 2015-10-01 LAB — GLUCOSE, CAPILLARY
GLUCOSE-CAPILLARY: 100 mg/dL — AB (ref 65–99)
Glucose-Capillary: 95 mg/dL (ref 65–99)
Glucose-Capillary: 93 mg/dL (ref 65–99)

## 2015-10-01 LAB — BODY FLUID CELL COUNT WITH DIFFERENTIAL
EOS FL: 0 %
Lymphs, Fluid: 65 %
MONOCYTE-MACROPHAGE-SEROUS FLUID: 29 %
Neutrophil Count, Fluid: 6 %
OTHER CELLS FL: 0 %
Total Nucleated Cell Count, Fluid: 249 cu mm

## 2015-10-01 LAB — AMYLASE: Amylase: 57 U/L (ref 28–100)

## 2015-10-01 LAB — PROTEIN, BODY FLUID

## 2015-10-01 LAB — GLUCOSE, SEROUS FLUID: Glucose, Fluid: 129 mg/dL

## 2015-10-01 LAB — LACTATE DEHYDROGENASE, PLEURAL OR PERITONEAL FLUID: LD FL: 38 U/L — AB (ref 3–23)

## 2015-10-01 LAB — BILIRUBIN, TOTAL: Total Bilirubin: 3.6 mg/dL — ABNORMAL HIGH (ref 0.3–1.2)

## 2015-10-01 LAB — ALBUMIN, FLUID (OTHER): Albumin, Fluid: <1 g/dL

## 2015-10-01 MED ORDER — SPIRONOLACTONE 25 MG PO TABS
100.0000 mg | ORAL_TABLET | Freq: Every day | ORAL | Status: DC
  Administered 2015-10-01 – 2015-10-03 (×3): 100 mg via ORAL
  Filled 2015-10-01 (×3): qty 4

## 2015-10-01 MED ORDER — MORPHINE SULFATE (PF) 2 MG/ML IV SOLN
1.0000 mg | Freq: Once | INTRAVENOUS | Status: AC
Start: 2015-10-01 — End: 2015-10-01
  Administered 2015-10-01: 17:00:00 1 mg via INTRAVENOUS
  Filled 2015-10-01: qty 1

## 2015-10-01 MED ORDER — NADOLOL 20 MG PO TABS
20.0000 mg | ORAL_TABLET | Freq: Every day | ORAL | Status: DC
  Administered 2015-10-01 – 2015-10-03 (×3): 20 mg via ORAL
  Filled 2015-10-01 (×4): qty 1

## 2015-10-01 NOTE — Plan of Care (Signed)
Problem: Education: Goal: Knowledge of [facility] Education information/materials will improve Outcome: Progressing Pt having abdominal paracentesis to be performed by Dr Lowella Dandy this shift

## 2015-10-01 NOTE — Procedures (Signed)
US guided paracentesis.  500 ml of amber colored fluid was removed.  No immediate complication.  Fluid sent for analysis.

## 2015-10-01 NOTE — Progress Notes (Signed)
[facility] - [place] at [facility]   PATIENT NAME: Holly Evans    MR#:  [id number]  DATE OF BIRTH:  1956-03-17  SUBJECTIVE: Sent in from Primary Dr. office because of worsening distention of abdomen. Patient has alcoholic liver cirrhosis. According to her ascites is new. Patient was a heavy drinker went through detox 2 weeks ago. Follows up with Dr. Mechele Collin but did not see him  recently.  Patient is abdominal distention but no shortness of breath and no hypoxia today.   CHIEF COMPLAINT:   Chief Complaint  Patient presents with  . Abdominal Pain    REVIEW OF SYSTEMS:   ROS CONSTITUTIONAL: No fever, fatigue or weakness.  EYES: No blurred or double vision.  EARS, NOSE, AND THROAT: No tinnitus or ear pain.  RESPIRATORY: No cough, shortness of breath, wheezing or hemoptysis.  CARDIOVASCULAR: No chest pain, orthopnea, edema.  GASTROINTESTINAL: No nausea, vomiting, diarrhea abdominal distention present  GENITOURINARY: No dysuria, hematuria.  ENDOCRINE: No polyuria, nocturia,  HEMATOLOGY: No anemia, easy bruising or bleeding SKIN: No rash or lesion. MUSCULOSKELETAL: No joint pain or arthritis.  No pedal edema NEUROLOGIC: No tingling, numbness, weakness.  PSYCHIATRY: No anxiety or depression.   DRUG ALLERGIES:   Allergies  Allergen Reactions  . Beef-Derived Products Anaphylaxis  . Banana Nausea And Vomiting  . Eggs Or Egg-Derived Products Nausea And Vomiting  . Lactase Nausea And Vomiting  . Sulfa Antibiotics Diarrhea    VITALS:  Blood pressure 125/62, pulse 95, temperature 99.2 F (37.3 C), temperature source Oral, resp. rate 18, height  (1.778 m), weight 74.844 kg (165 lb), SpO2 96 %.  PHYSICAL EXAMINATION:  GENERAL:  60 y.o.-year-old patient lying in the bed with no acute distress.  EYES: Pupils equal, round, reactive to light and accommodation. No scleral icterus. Extraocular muscles intact.  HEENT: Head atraumatic, normocephalic.  Oropharynx and nasopharynx clear.  NECK:  Supple, no jugular venous distention. No thyroid enlargement, no tenderness.  LUNGS: Normal breath sounds bilaterally, no wheezing, rales,rhonchi or crepitation. No use of accessory muscles of respiration.  CARDIOVASCULAR: S1, S2 normal. No murmurs, rubs, or gallops.  ABDOMEN: Soft, nontender, distended with ascites.. Bowel sounds present. No organomegaly or mass.  EXTREMITIES: No pedal edema, cyanosis, or clubbing.  NEUROLOGIC: Cranial nerves II through XII are intact. Muscle strength 5/5 in all extremities. Sensation intact. Gait not checked.  PSYCHIATRIC: The patient is alert and oriented x 3.  SKIN: No obvious rash, lesion, or ulcer.    LABORATORY PANEL:   CBC  Recent Labs Lab 10/01/15 0433  WBC 4.1  HGB 8.0*  HCT 25.1*  PLT 82*   ------------------------------------------------------------------------------------------------------------------  Chemistries   Recent Labs Lab 09/30/15 1705 09/30/15 2032 10/01/15 0433  NA 134*  --  137  K 2.9*  --  3.7  CL 104  --  109  CO2 22  --  24  GLUCOSE 114*  --  98  BUN 10  --  11  CREATININE 0.64  --  0.66  CALCIUM 8.3*  --  8.0*  MG  --  1.8  --   AST 126*  --   --   ALT 36  --   --   ALKPHOS 116  --   --   BILITOT 4.2*  --   --    ------------------------------------------------------------------------------------------------------------------  Cardiac Enzymes No results for input(s): TROPONINI in the last 168 hours. ------------------------------------------------------------------------------------------------------------------  RADIOLOGY:  Dg Chest Portable 1 View  09/30/2015  CLINICAL DATA:  Difficulty breathing, abdominal pain and abdominal distention. Cirrhosis. EXAM: PORTABLE CHEST 1 VIEW COMPARISON:  11/11/2014 FINDINGS: Normal mediastinum and cardiac silhouette. Normal pulmonary vasculature. No evidence of effusion, infiltrate, or pneumothorax. No acute bony  abnormality. IMPRESSION: No acute cardiopulmonary process. Electronically Signed   By: Genevive Bi M.D.   On: 09/30/2015 19:36    EKG:   Orders placed or performed in visit on 04/20/14  . EKG 12-Lead    ASSESSMENT AND PLAN:   1. ascites secondary to liver cirrhosis: Started the Lasix, Aldactone. Patient was not on it before. I'll follow up with GI consult.. I don't think she needs emergency paracentesis. No evidence of fever without leukocytosis or abdominal pain. #2 portal hypertension: Had EGD done in June of last year showing portal hypertensive gastropathy. Patient supposed to see Dr. Mechele Collin but never followed up with him and continued to drink. Start on theB blocker.  3. heavy alcohol abuse: Patient says she quit 2 weeks ago. Last drink was 2 weeks ago. #4 hypokalemia replaced potassium is better now. Hyponatremia improved. Anemia and thrombocytopenia secondary to liver cirrhosis. All the records are reviewed and case discussed with Care Management/Social Workerr. Management plans discussed with the patient, family and they are in agreement.  CODE STATUS: Full  TOTAL TIME TAKING CARE OF THIS PATIENT: 36 minutes.   POSSIBLE D/C IN 1-2 DAYS, DEPENDING ON CLINICAL CONDITION. More than 50% of time spent in coordination of care  Niklas Chretien M.D on 10/01/2015 at 8:04 AM  Between 7am to 6pm - Pager - [phone]  After 6pm go to [email or web address] - password [email or web address]  [facility]  Office  [phone]  CC: Primary care physician; Sula Rumple, MD   Note: This dictation was prepared with Dragon dictation along with smaller phrase technology. Any transcriptional errors that result from this process are unintentional.

## 2015-10-01 NOTE — Progress Notes (Signed)
Written signed consent obtained from pt for ultrasound paracentesis to be performed by Dr Lowella Dandy

## 2015-10-01 NOTE — Plan of Care (Signed)
Problem: Fluid Volume: Goal: Ability to maintain a balanced intake and output will improve Outcome: Not Progressing Patient admitted for ascites.  Abdomen distended and tender.  Scheduled for U/S Paracentesis this AM.

## 2015-10-02 LAB — TRIGLYCERIDES, BODY FLUIDS: Triglycerides, Fluid: 26 mg/dL

## 2015-10-02 LAB — CBC
HEMATOCRIT: 25 % — AB (ref 35.0–47.0)
HEMOGLOBIN: 7.9 g/dL — AB (ref 12.0–16.0)
MCH: 24 pg — AB (ref 26.0–34.0)
MCHC: 31.7 g/dL — ABNORMAL LOW (ref 32.0–36.0)
MCV: 75.8 fL — AB (ref 80.0–100.0)
Platelets: 80 10*3/uL — ABNORMAL LOW (ref 150–440)
RBC: 3.3 MIL/uL — AB (ref 3.80–5.20)
RDW: 20 % — ABNORMAL HIGH (ref 11.5–14.5)
WBC: 3.6 10*3/uL (ref 3.6–11.0)

## 2015-10-02 NOTE — Progress Notes (Signed)
Deleted previous assessment reentered last assessment

## 2015-10-02 NOTE — Progress Notes (Signed)
[facility] - [place] at [facility] [place]   PATIENT NAME: Holly Evans    MR#:  [id number]  DATE OF BIRTH:  Dec 14, 1955  SUBJECTIVE: Sent in from Primary Dr. office because of worsening distention of abdomen. Patient has alcoholic liver cirrhosis. According to her ascites is new. She says she feels better today. Ascitic  fluid cultures are still pending.   CHIEF COMPLAINT:   Chief Complaint  Patient presents with  . Abdominal Pain    REVIEW OF SYSTEMS:   ROS CONSTITUTIONAL: No fever, fatigue or weakness.  EYES: No blurred or double vision.  EARS, NOSE, AND THROAT: No tinnitus or ear pain.  RESPIRATORY: No cough, shortness of breath, wheezing or hemoptysis.  CARDIOVASCULAR: No chest pain, orthopnea, edema.  GASTROINTESTINAL: No nausea, vomiting, diarrhea ,less abdominal distention.  GENITOURINARY: No dysuria, hematuria.  ENDOCRINE: No polyuria, nocturia,  HEMATOLOGY: No anemia, easy bruising or bleeding SKIN: No rash or lesion. MUSCULOSKELETAL: No joint pain or arthritis.  No pedal edema NEUROLOGIC: No tingling, numbness, weakness.  PSYCHIATRY: No anxiety or depression.   DRUG ALLERGIES:   Allergies  Allergen Reactions  . Beef-Derived Products Anaphylaxis  . Banana Nausea And Vomiting  . Eggs Or Egg-Derived Products Nausea And Vomiting  . Lactase Nausea And Vomiting  . Sulfa Antibiotics Diarrhea    VITALS:  Blood pressure 103/55, pulse 70, temperature 98.4 F (36.9 C), temperature source Oral, resp. rate 18, height  (1.778 m), weight 74.844 kg (165 lb), SpO2 96 %.  PHYSICAL EXAMINATION:  GENERAL:  60 y.o.-year-old patient lying in the bed with no acute distress.  EYES: Pupils equal, round, reactive to light and accommodation. No scleral icterus. Extraocular muscles intact.  HEENT: Head atraumatic, normocephalic. Oropharynx and nasopharynx clear.  NECK:  Supple, no jugular venous distention. No thyroid enlargement, no tenderness.  LUNGS: Normal  breath sounds bilaterally, no wheezing, rales,rhonchi or crepitation. No use of accessory muscles of respiration.  CARDIOVASCULAR: S1, S2 normal. No murmurs, rubs, or gallops.  ABDOMEN: Soft, nontender, distended with ascites.. Bowel sounds present. No organomegaly or mass.  EXTREMITIES: No pedal edema, cyanosis, or clubbing.  NEUROLOGIC: Cranial nerves II through XII are intact. Muscle strength 5/5 in all extremities. Sensation intact. Gait not checked.  PSYCHIATRIC: The patient is alert and oriented x 3.  SKIN: No obvious rash, lesion, or ulcer.    LABORATORY PANEL:   CBC  Recent Labs Lab 10/02/15 0718  WBC 3.6  HGB 7.9*  HCT 25.0*  PLT 80*   ------------------------------------------------------------------------------------------------------------------  Chemistries   Recent Labs Lab 09/30/15 1705 09/30/15 2032 10/01/15 0433  NA 134*  --  137  K 2.9*  --  3.7  CL 104  --  109  CO2 22  --  24  GLUCOSE 114*  --  98  BUN 10  --  11  CREATININE 0.64  --  0.66  CALCIUM 8.3*  --  8.0*  MG  --  1.8  --   AST 126*  --   --   ALT 36  --   --   ALKPHOS 116  --   --   BILITOT 4.2*  --  3.6*   ------------------------------------------------------------------------------------------------------------------  Cardiac Enzymes No results for input(s): TROPONINI in the last 168 hours. ------------------------------------------------------------------------------------------------------------------  RADIOLOGY:  US Abdomen Limited  10/01/2015  CLINICAL DATA:  Abdominal distension EXAM: LIMITED ABDOMINAL ULTRASOUND COMPARISON:  11/13/2014 FINDINGS: Four quadrant survey ultrasound was performed. There is a moderate amount of abdominal ascites, new from prior exam.  IMPRESSION: 1. Moderate abdominal ascites, new since previous exam. Electronically Signed   By: Corlis Leak M.D.   On: 10/01/2015 10:13   US Paracentesis  10/02/2015  INDICATION: 60 year old with cirrhosis, abdominal  distention and ascites. Request for paracentesis. EXAM: ULTRASOUND GUIDED PARACENTESIS MEDICATIONS: None. COMPLICATIONS: None immediate. PROCEDURE: Informed written consent was obtained from the patient after a discussion of the risks, benefits and alternatives to treatment. A timeout was performed prior to the initiation of the procedure. Ultrasound imaging demonstrates a small to moderate amount of ascites on both sides of the abdomen. Location of the fluid was difficult for paracentesis. Most accessible site was in the inferior perihepatic region. Right side of the abdomen was prepped with chlorhexidine and a sterile field was created. Skin was anesthetized with 1% lidocaine. A Safe-T-Centesis catheter was directed into the peritoneal cavity. This was very uncomfortable for the patient and this catheter was tenting the peritoneum. Due to the adjacent liver, the catheter cannot be safely advanced. Following this, a Yueh catheter was introduced with ultrasound guidance. Yueh catheter was able to advance into the ascites easier than the Safe-T-Centesis catheter. The paracentesis was performed. The catheter was removed and a dressing was applied. The patient tolerated the procedure well without immediate post procedural complication. FINDINGS: A total of approximately 500 mL of amber color fluid was removed. Samples were sent to the laboratory as requested by the clinical team. IMPRESSION: Successful ultrasound-guided paracentesis yielding 500 mL of peritoneal fluid. Electronically Signed   By: Richarda Overlie M.D.   On: 10/02/2015 08:25   Dg Chest Portable 1 View  09/30/2015  CLINICAL DATA:  Difficulty breathing, abdominal pain and abdominal distention. Cirrhosis. EXAM: PORTABLE CHEST 1 VIEW COMPARISON:  11/11/2014 FINDINGS: Normal mediastinum and cardiac silhouette. Normal pulmonary vasculature. No evidence of effusion, infiltrate, or pneumothorax. No acute bony abnormality. IMPRESSION: No acute cardiopulmonary  process. Electronically Signed   By: Genevive Bi M.D.   On: 09/30/2015 19:36    EKG:   Orders placed or performed in visit on 04/20/14  . EKG 12-Lead    ASSESSMENT AND PLAN:   1. ascites secondary to liver cirrhosis: Started the Lasix, Aldactone. S/p post-paracentesis.500 ml fluid is removed. Cultures from ascitic  fluid is still pending. We will wait till tomorrow to discharge the patient because of cloudy fluid analysis.  #2 portal hypertension: Had EGD done in June of last year showing portal hypertensive gastropathy. Patient supposed to see Dr. Mechele Collin but never followed up with him and continued to drink. Start on theB blocker. Follow-up with with Dr. Mechele Collin as an outpatient. No inpatient to the GI consult is required at this time. 3. heavy alcohol abuse: Patient says she quit 2 weeks ago. Last drink was 2 weeks ago. #4 hypokalemia replaced potassium is better now. Hyponatremia improved. Anemia and thrombocytopenia secondary to liver cirrhosis. All the records are reviewed and case discussed with Care Management/Social Workerr. Management plans discussed with the patient, family and they are in agreement.  CODE STATUS: Full  TOTAL TIME TAKING CARE OF THIS PATIENT: .   POSSIBLE D/C IN 1-2 DAYS, DEPENDING ON CLINICAL CONDITION. More than 50% of time spent in coordination of care  Damare Serano M.D on 10/02/2015 at 10:33 AM  Between 7am to 6pm - Pager - [phone]  After 6pm go to [email or web address] - password [email or web address]  [facility]  Office  [phone]  CC: Primary care physician; Sula Rumple, MD   Note: This dictation was prepared with Dragon dictation along  with smaller phrase technology. Any transcriptional errors that result from this process are unintentional.

## 2015-10-03 MED ORDER — SPIRONOLACTONE 100 MG PO TABS: 100.0000 mg | ORAL_TABLET | Freq: Every day | ORAL | Status: AC

## 2015-10-03 MED ORDER — NADOLOL 20 MG PO TABS: 20.0000 mg | ORAL_TABLET | Freq: Every day | ORAL | Status: AC

## 2015-10-03 MED ORDER — FUROSEMIDE 20 MG PO TABS: 20.0000 mg | ORAL_TABLET | Freq: Every day | ORAL | Status: AC

## 2015-10-03 NOTE — Progress Notes (Signed)
Pt for discharge home. Alert. No resp distress.sl d/cd/ instructions discussed with pt. presc discussed and home meds  Diet / activity and f/u discussed.  Verbalize understanding. Home via w/c at this time w/o c/o.

## 2015-10-03 NOTE — Discharge Summary (Signed)
Holly Evans, is a 60 y.o. female  DOB Sep 05, 1955  MRN [id number].  Admission date:  09/30/2015  Admitting Physician  Shaune Pollack, MD  Discharge Date:  10/03/2015   Primary MD  Sula Rumple, MD  Recommendations for primary care physician for things to follow:  Follow up with  Dr. Mechele Collin in one week.   Admission Diagnosis  Abdominal distension [R14.0]   Discharge Diagnosis  Abdominal distension [R14.0]    Principal Problem:   Ascites Active Problems:   Hypokalemia      Past Medical History  Diagnosis Date  . Depression   . IBS (irritable bowel syndrome)   . Endometriosis   . COPD (chronic obstructive pulmonary disease) (HCC)   . Anxiety   . GERD (gastroesophageal reflux disease)   . Anemia     Iron Deficient  . Alcoholic cirrhosis of liver without ascites (HCC)   . GI bleed     Past Surgical History  Procedure Laterality Date  . Dilation and curettage of uterus    . Tonsillectomy    . Tubal ligation    . Augmentation mammapolasty and abdominoplasty    . Laparoscopic lysis of adhesions    . Esophagogastroduodenoscopy N/A 02/04/2015    Procedure: ESOPHAGOGASTRODUODENOSCOPY (EGD);  Surgeon: Scot Jun, MD;  Location: [facility] ENDOSCOPY;  Service: Endoscopy;  Laterality: N/A;       History of present illness and  Hospital Course:     Kindly see H&P for history of present illness and admission details, please review complete Labs, Consult reports and Test reports for all details in brief  HPI  from the history and physical done on the day of admission   60 year old female patient with heavy alcohol abuse admitted because of abdominal pain and abdominal distention, new onset ascites. Sent in from primary doctor's office because of ascites and probably will need for paracentesis.   Hospital Course    #1 liver cirrhosis with ascites: Patient is a heavy drinker, found to have this abdominal swelling vessel ascites. Started on IV Lasix. Patient had ultrasound of the liver which showed moderate ascites, had a paracentesis done by IR and drainage and minimal of fluid. Acetic fluid cultures are negative without any growth. Patient was following up with Dr. Mechele Collin performed but did not follow up with him in one yr. had EGD last year which showed portal hypertensive gastropathy. I started  Her on   Atenolol.. I advised the patient to quit alcohol completely,  And establish  care with Dr. Mechele Collin regarding the liver cirrhosis and ascites. Advise a low-salt diet, continue Lasix and Aldactone, and Atenolol  #2 hypokalemia: Replace the potassium. Hyponatremia secondary to alcohol abuse and liver cirrhosis: Improved. 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 Initially GI consult was placed but I did not think it needed while she is in here so I spoke with Dr.Darren Servando Snare , as he is said the there is nothing else to add .we discontinued GI consult, 5 history of COPD stable. #6 history of depression stable ;continue Prozac, Discharge Condition: stable   Follow UP  Follow-up Information    Follow up with Sula Rumple, MD In 1 week.   Specialty:  Family Medicine   Contact information:   [address] [place] [address] [phone]       Follow up with Lynnae Prude, MD In 1 week.   Specialty:  Gastroenterology   Contact information:   [address] [facility] -  GASTROENTEROLOGY [place] [address] [phone]       Follow up with Sula Rumple, MD. Go on 10/11/2015.   Specialty:  Family Medicine   Why:  at 1:30 pm   Contact information:   [address] [place] [address] [phone]       Follow up with Lynnae Prude, MD. Go in 1 week.   Specialty:  Gastroenterology   Why:  at 3:30 pm  monday   Contact information:   [address]  [facility] [address] [facility] - GASTROENTEROLOGY [place] [address] [phone]       Follow up with Lynnae Prude, MD. Go on 10/10/2015.   Specialty:  Gastroenterology   Why:  monday at 3;30   Contact information:   [address] [facility] - GASTROENTEROLOGY [place] [address] [phone]         Discharge Instructions  and  Discharge Medications        Medication List    STOP taking these medications        multivitamin with minerals Tabs tablet      TAKE these medications        FLUoxetine 20 MG capsule  Commonly known as:  PROZAC  Take 20 mg by mouth daily.     Fluticasone-Salmeterol 100-50 MCG/DOSE Aepb  Commonly known as:  ADVAIR  Inhale 1 puff into the lungs 2 (two) times daily.     furosemide 20 MG tablet  Commonly known as:  LASIX  Take 1 tablet (20 mg total) by mouth daily.     nadolol 20 MG tablet  Commonly known as:  CORGARD  Take 1 tablet (20 mg total) by mouth daily.     omeprazole 20 MG capsule  Commonly known as:  PRILOSEC  Take 20 mg by mouth daily.     spironolactone 100 MG tablet  Commonly known as:  ALDACTONE  Take 1 tablet (100 mg total) by mouth daily.          Diet and Activity recommendation: See Discharge Instructions above   Consults obtained - none   Major procedures and Radiology Reports - PLEASE review detailed and final reports for all details, in brief -     US Abdomen Limited  10/01/2015  CLINICAL DATA:  Abdominal distension EXAM: LIMITED ABDOMINAL ULTRASOUND COMPARISON:  11/13/2014 FINDINGS: Four quadrant survey ultrasound was performed. There is a moderate amount of abdominal ascites, new from prior exam. IMPRESSION: 1. Moderate abdominal ascites, new since previous exam. Electronically Signed   By: Corlis Leak M.D.   On: 10/01/2015 10:13   US Paracentesis  10/02/2015  INDICATION: 60 year old with cirrhosis, abdominal distention and ascites. Request for paracentesis. EXAM:  ULTRASOUND GUIDED PARACENTESIS MEDICATIONS: None. COMPLICATIONS: None immediate. PROCEDURE: Informed written consent was obtained from the patient after a discussion of the risks, benefits and alternatives to treatment. A timeout was performed prior to the initiation of the procedure. Ultrasound imaging demonstrates a small to moderate amount of ascites on both sides of the abdomen. Location of the fluid was difficult for paracentesis. Most accessible site was in the inferior perihepatic region. Right side of the abdomen was prepped with chlorhexidine and a sterile field was created. Skin was anesthetized with 1% lidocaine. A Safe-T-Centesis catheter was directed into the peritoneal cavity. This was very uncomfortable for the patient and this catheter was tenting the peritoneum. Due to the adjacent liver, the catheter cannot be safely advanced. Following this, a Yueh catheter was introduced with ultrasound guidance.  Yueh catheter was able to advance into the ascites easier than the Safe-T-Centesis catheter. The paracentesis was performed. The catheter was removed and a dressing was applied. The patient tolerated the procedure well without immediate post procedural complication. FINDINGS: A total of approximately 500 mL of amber color fluid was removed. Samples were sent to the laboratory as requested by the clinical team. IMPRESSION: Successful ultrasound-guided paracentesis yielding 500 mL of peritoneal fluid. Electronically Signed   By: Richarda Overlie M.D.   On: 10/02/2015 08:25   Dg Chest Portable 1 View  09/30/2015  CLINICAL DATA:  Difficulty breathing, abdominal pain and abdominal distention. Cirrhosis. EXAM: PORTABLE CHEST 1 VIEW COMPARISON:  11/11/2014 FINDINGS: Normal mediastinum and cardiac silhouette. Normal pulmonary vasculature. No evidence of effusion, infiltrate, or pneumothorax. No acute bony abnormality. IMPRESSION: No acute cardiopulmonary process. Electronically Signed   By: Genevive Bi M.D.    On: 09/30/2015 19:36    Micro Results     Recent Results (from the past 240 hour(s))  Body fluid culture     Status: None (Preliminary result)   Collection Time: 10/01/15  4:33 PM  Result Value Ref Range Status   Specimen Description PERITONEAL  Final   Special Requests NONE  Final   Gram Stain   Final    MANY RED BLOOD CELLS FEW WBC SEEN NO ORGANISMS SEEN    Culture NO GROWTH 2 DAYS  Final   Report Status PENDING  Incomplete       Today   Subjective:   Holly Evans today has no headache,no chest abdominal pain,no new weakness tingling or numbness, feels much better wants to go home today.   Objective:   Blood pressure 109/52, pulse 71, temperature 98.6 F (37 C), temperature source Oral, resp. rate 22, height  (1.778 m), weight 74.844 kg (165 lb), SpO2 98 %.   Intake/Output Summary (Last 24 hours) at 10/03/15 1236 Last data filed at 10/03/15 0800  Gross per 24 hour  Intake    120 ml  Output      0 ml  Net    120 ml    Exam Awake Alert, Oriented x 3, No new F.N deficits, Normal affect Vina.AT,PERRAL Supple Neck,No JVD, No cervical lymphadenopathy appriciated.  Symmetrical Chest wall movement, Good air movement bilaterally, CTAB RRR,No Gallops,Rubs or new Murmurs, No Parasternal Heave +ve B.Sounds, Abd Soft, Non tender, No organomegaly appriciated, No rebound -guarding or rigidity. No Cyanosis, Clubbing or edema, No new Rash or bruise  Data Review   CBC w Diff: Lab Results  Component Value Date   WBC 3.6 10/02/2015   WBC 2.8* 11/26/2014   HGB 7.9* 10/02/2015   HGB 10.4* 11/26/2014   HCT 25.0* 10/02/2015   HCT 31.8* 11/26/2014   PLT 80* 10/02/2015   PLT 103* 11/26/2014   LYMPHOPCT 41% 01/28/2015   LYMPHOPCT 27.9 11/26/2014   MONOPCT 9% 01/28/2015   MONOPCT 8.8 11/26/2014   EOSPCT 6% 01/28/2015   EOSPCT 2.8 11/26/2014   BASOPCT 2% 01/28/2015   BASOPCT 2.4 11/26/2014    CMP: Lab Results  Component Value Date   NA 137 10/01/2015   NA 141  04/20/2014   K 3.7 10/01/2015   K 3.5 04/20/2014   CL 109 10/01/2015   CL 107 04/20/2014   CO2 24 10/01/2015   CO2 24 04/20/2014   BUN 11 10/01/2015   BUN 7 04/20/2014   CREATININE 0.66 10/01/2015   CREATININE 0.69 04/20/2014   PROT 5.9* 09/30/2015   PROT 7.6  04/20/2014   ALBUMIN 2.8* 09/30/2015   ALBUMIN 3.8 04/20/2014   BILITOT 3.6* 10/01/2015   BILITOT 0.7 04/20/2014   ALKPHOS 116 09/30/2015   ALKPHOS 155* 04/20/2014   AST 126* 09/30/2015   AST 152* 04/20/2014   ALT 36 09/30/2015   ALT 99* 04/20/2014  .   Total Time in preparing paper work, data evaluation and todays exam - 35 minutes  Christiona Siddique M.D on 10/03/2015 at 12:36 PM    Note: This dictation was prepared with Dragon dictation along with smaller phrase technology. Any transcriptional errors that result from this process are unintentional.

## 2015-10-04 LAB — CYTOLOGY - NON PAP

## 2015-10-05 LAB — BODY FLUID CULTURE: Culture: NO GROWTH

## 2015-10-10 ENCOUNTER — Encounter: Payer: Self-pay | Admitting: Nurse Practitioner

## 2015-10-20 LAB — PH, BODY FLUID: PH, BODY FLUID: 7.4

## 2015-11-07 ENCOUNTER — Other Ambulatory Visit: Payer: Self-pay | Admitting: Gastroenterology

## 2015-11-07 DIAGNOSIS — K7031 Alcoholic cirrhosis of liver with ascites: Secondary | ICD-10-CM

## 2015-11-14 ENCOUNTER — Ambulatory Visit
Admission: RE | Admit: 2015-11-14 | Discharge: 2015-11-14 | Disposition: A | Discharge status: Home / Self Care | Payer: 59 | Source: Ambulatory Visit | Attending: Gastroenterology | Admitting: Gastroenterology

## 2015-11-14 DIAGNOSIS — K7031 Alcoholic cirrhosis of liver with ascites: Secondary | ICD-10-CM | POA: Insufficient documentation

## 2015-11-14 DIAGNOSIS — R161 Splenomegaly, not elsewhere classified: Secondary | ICD-10-CM | POA: Diagnosis not present

## 2015-11-14 DIAGNOSIS — K8020 Calculus of gallbladder without cholecystitis without obstruction: Secondary | ICD-10-CM | POA: Insufficient documentation

## 2015-11-14 DIAGNOSIS — R188 Other ascites: Secondary | ICD-10-CM | POA: Diagnosis not present

## 2015-12-05 ENCOUNTER — Ambulatory Visit: Admission: RE | Admit: 2015-12-05 | Payer: 59 | Source: Ambulatory Visit | Admitting: Gastroenterology

## 2015-12-05 ENCOUNTER — Encounter: Admission: RE | Payer: Self-pay | Source: Ambulatory Visit

## 2015-12-05 SURGERY — ESOPHAGOGASTRODUODENOSCOPY (EGD) WITH PROPOFOL
Anesthesia: General

## 2016-04-27 DEATH — deceased

## 2017-10-04 IMAGING — US US ABDOMEN COMPLETE
1 series · 13 of 25 positions shown · non-contrast
Comparison: Right upper quadrant ultrasound dated November 13, 2014

CLINICAL DATA: Alcoholic cirrhosis, ascites

EXAM:
ABDOMEN ULTRASOUND COMPLETE

[Series 1: us abdomen complete · 0.19mm/px · 13 of 120 slices shown]
[im 1/120]
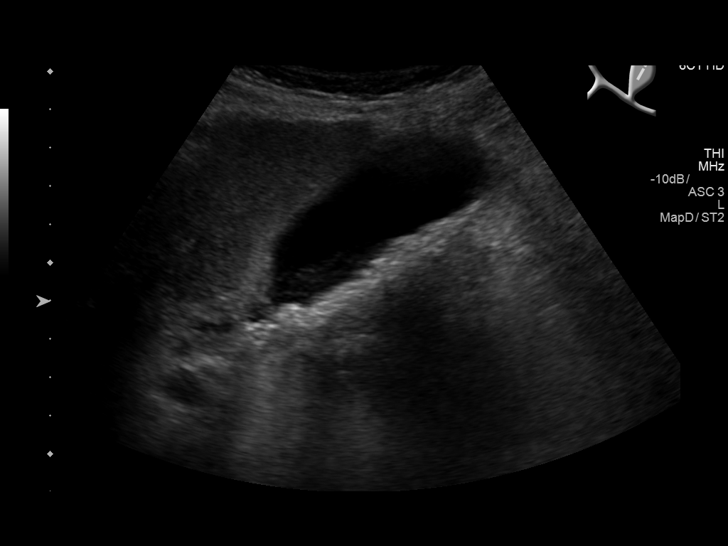
[im 10/120]
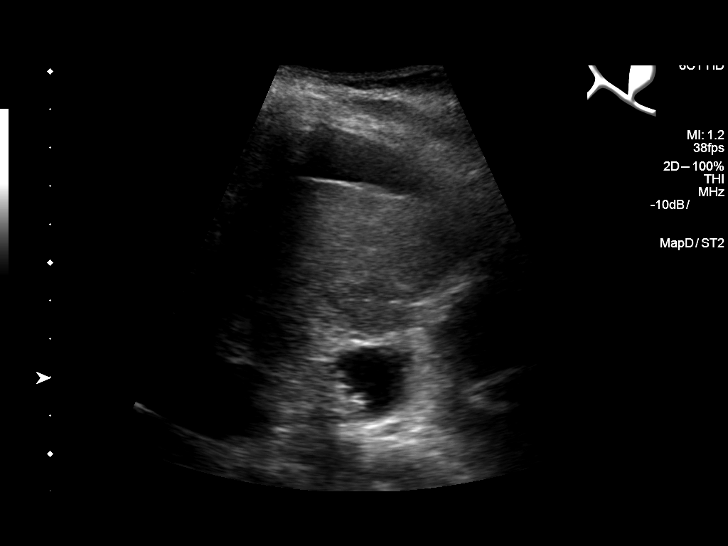
[im 20/120]
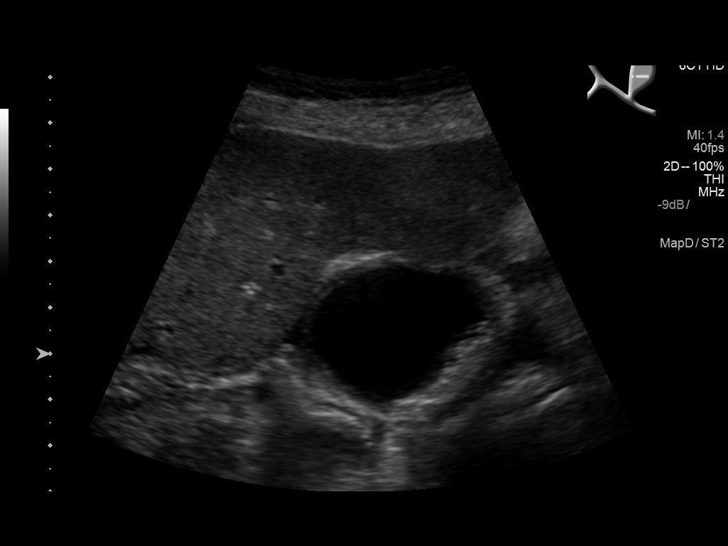
[im 30/120]
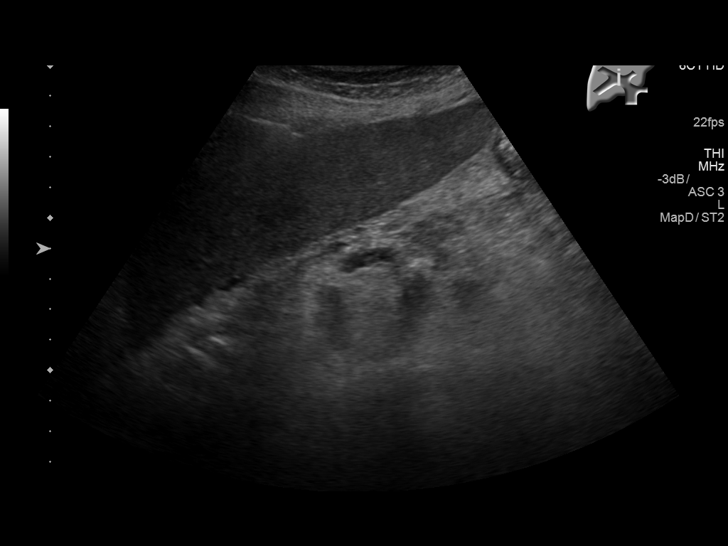
[im 40/120]
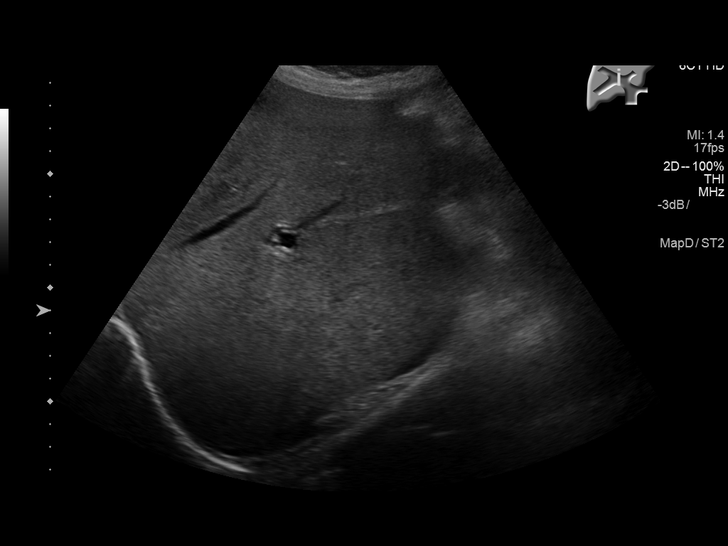
[im 50/120]
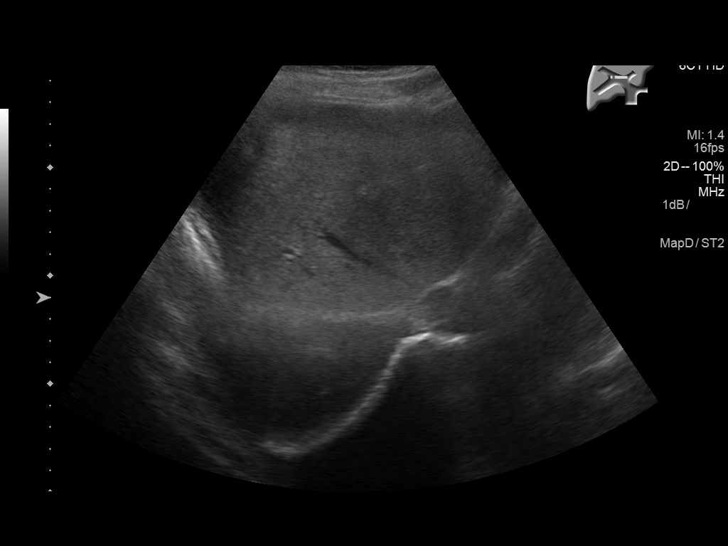
[im 60/120]
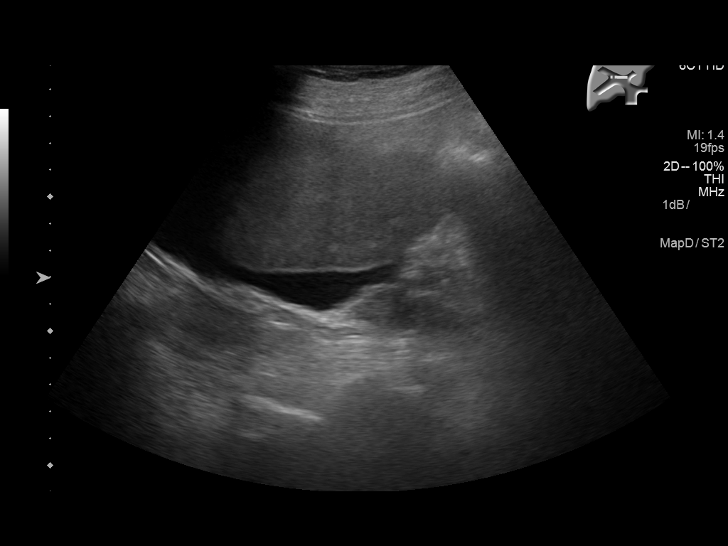
[im 70/120]
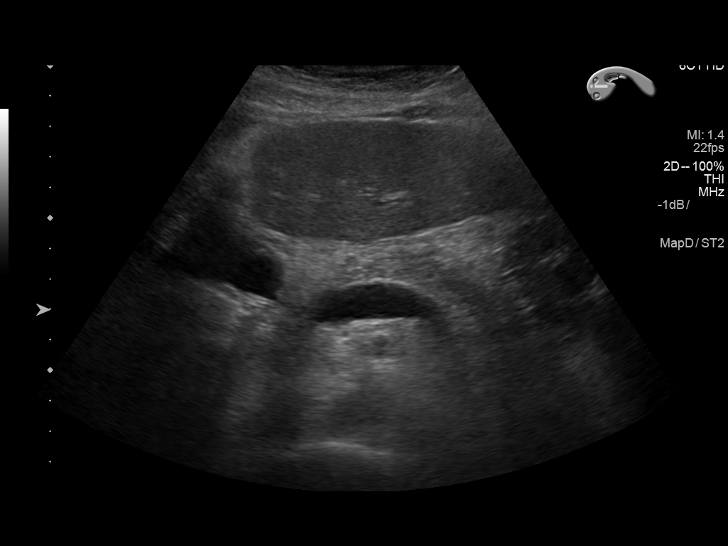
[im 80/120]
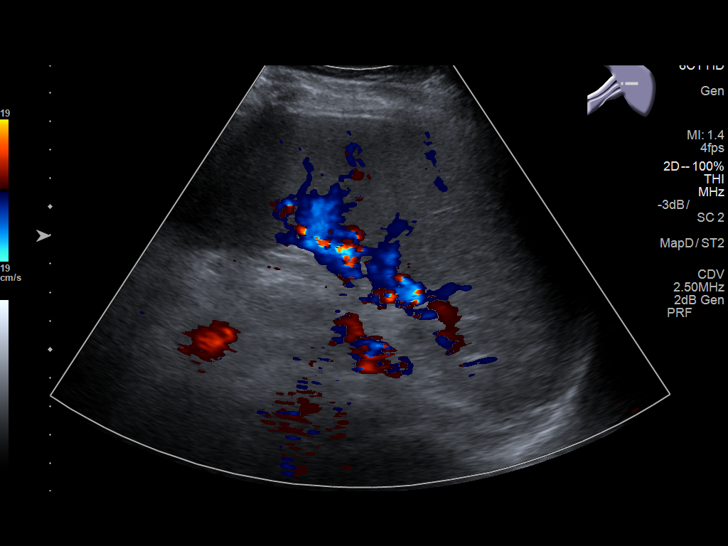
[im 90/120]
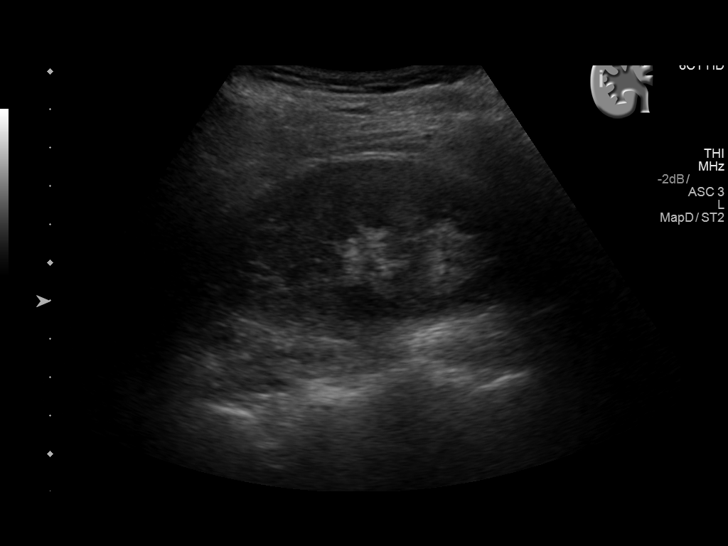
[im 100/120]
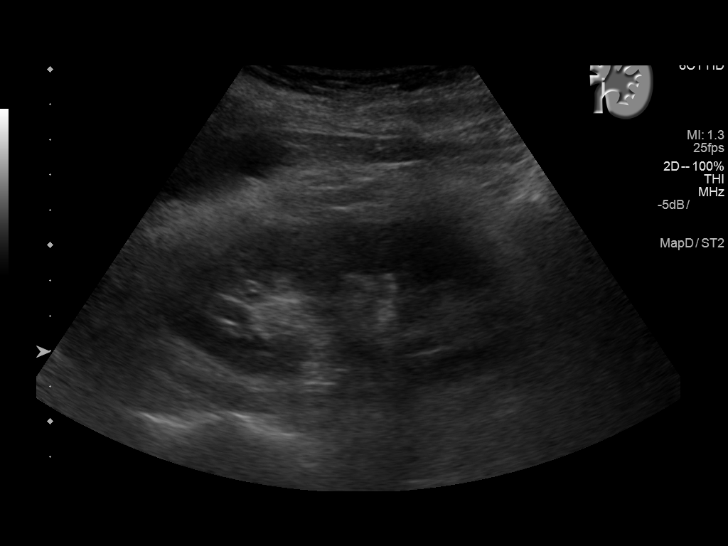
[im 110/120]
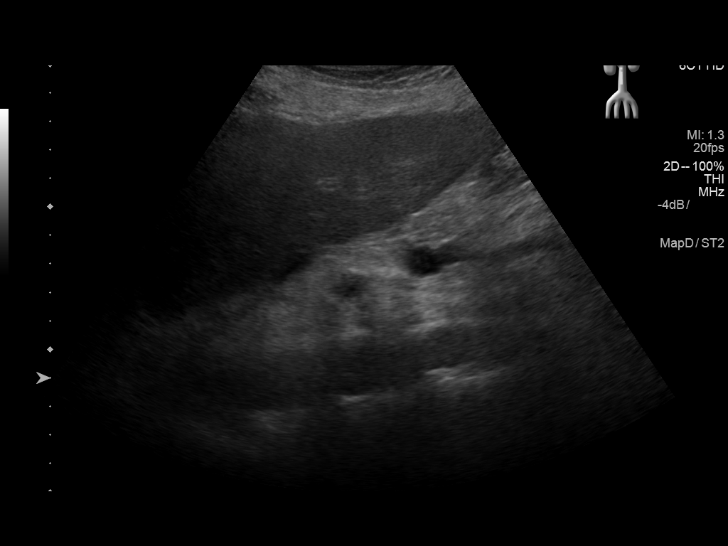
[im 120/120]
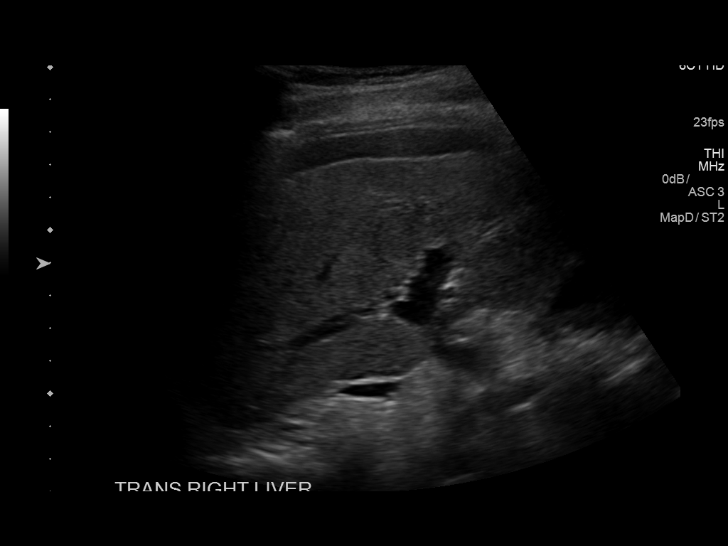

[13 of 25 positions shown; findings below may reference images not displayed]

FINDINGS: Gallbladder: The gallbladder is adequately distended. There are
multiple echogenic mobile shadowing stones with the largest
measuring 7 mm in diameter. The gallbladder wall is mildly thickened
at 3.4 mm with a small amount of intramural fluid. There is no
definite pericholecystic fluid. There is no positive sonographic
Murphy's sign.

Common bile duct: Diameter: 4.3 mm

Liver: The hepatic echotexture is increased diffusely. The surface
contour of the liver is nodular. There is no discrete mass nor
ductal dilation.

IVC: No abnormality visualized.

Pancreas: Visualized portion unremarkable.

Spleen: There is splenomegaly with calculated splenic volume of 545
cc.

Right Kidney: Length: 9.7 cm. Echogenicity within normal limits. No
mass or hydronephrosis visualized.

Left Kidney: Length: 10.6 cm. Echogenicity within normal limits. No
mass or hydronephrosis visualized.

Abdominal aorta: The visualized portions of the abdominal aorta are
normal.

Other findings: There is ascites in all 4 quadrants of the abdomen.
This ascites prevented performing the elastography portion of the
study.
IMPRESSION: 1. Again demonstrated are cirrhotic changes within the liver. No
discrete hepatic masses are observed. There is ascites in all 4
quadrants of the abdomen.
2. Gallstones and sludge within the gallbladder with mild chronic
gallbladder wall thickening without positive sonographic Murphy's
sign.
3. Splenomegaly.

## 2017-10-15 IMAGING — US US PARACENTESIS
1 series · 5 of 5 positions shown · non-contrast
Comparison: none

INDICATION: 59-year-old with cirrhosis, abdominal distention and ascites.
Request for paracentesis.

[Series 1: us paracentesis · 0.26mm/px · 5 of 5 slices shown]
[im 1/5]
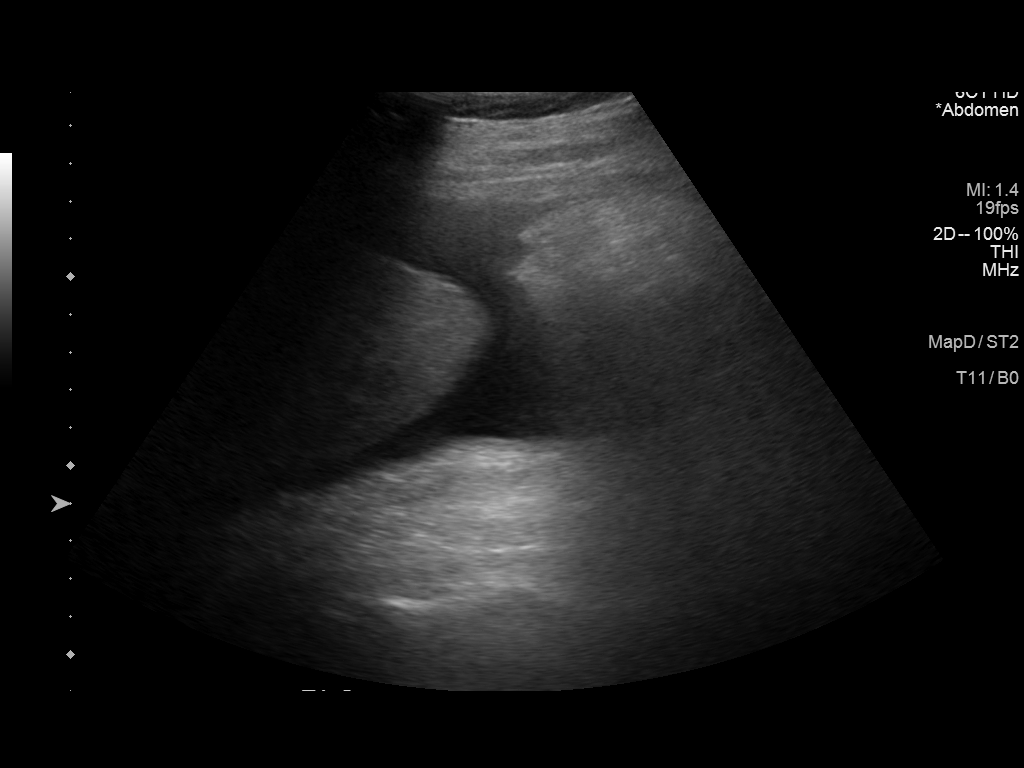
[im 2/5]
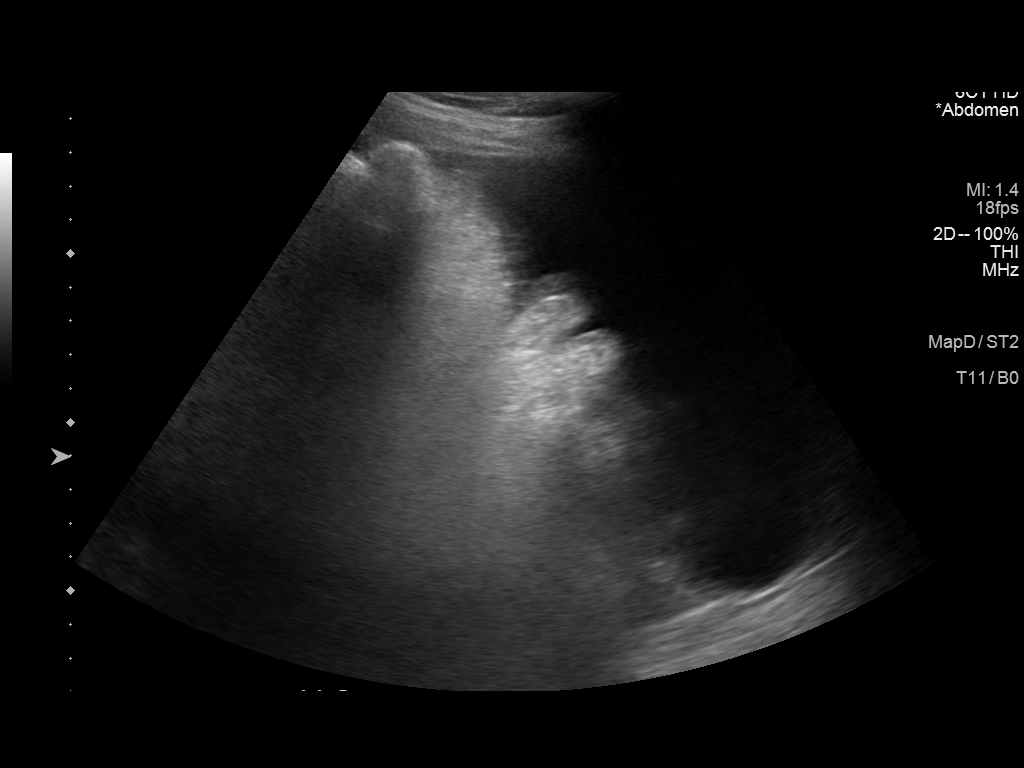
[im 3/5]
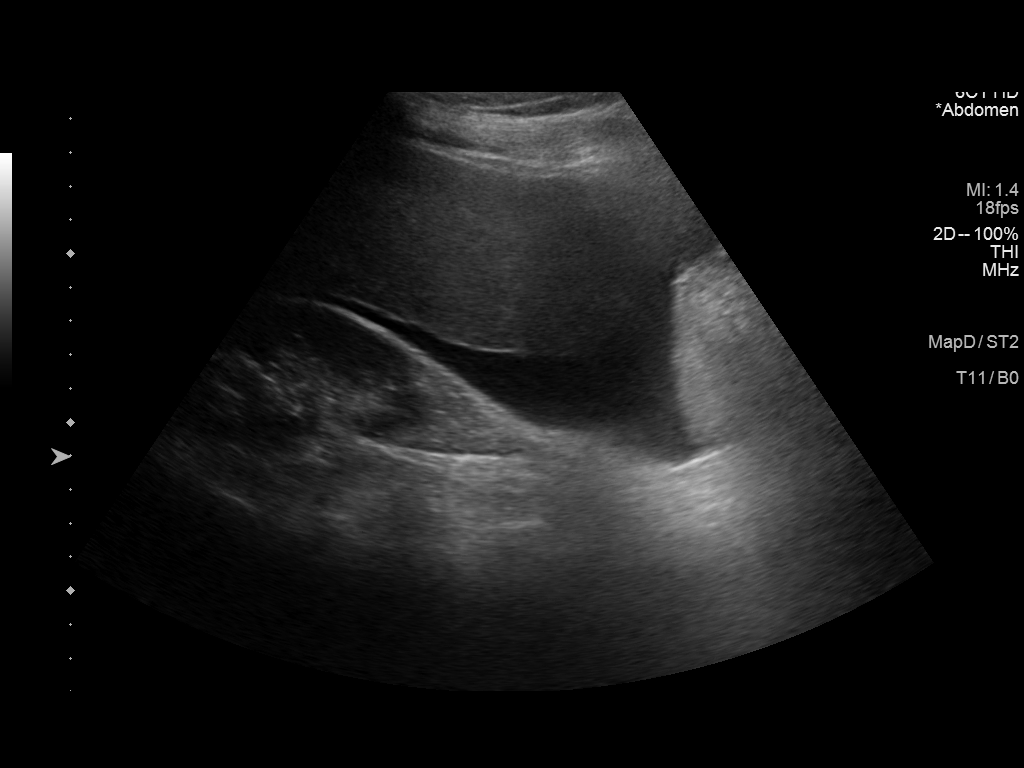
[im 4/5]
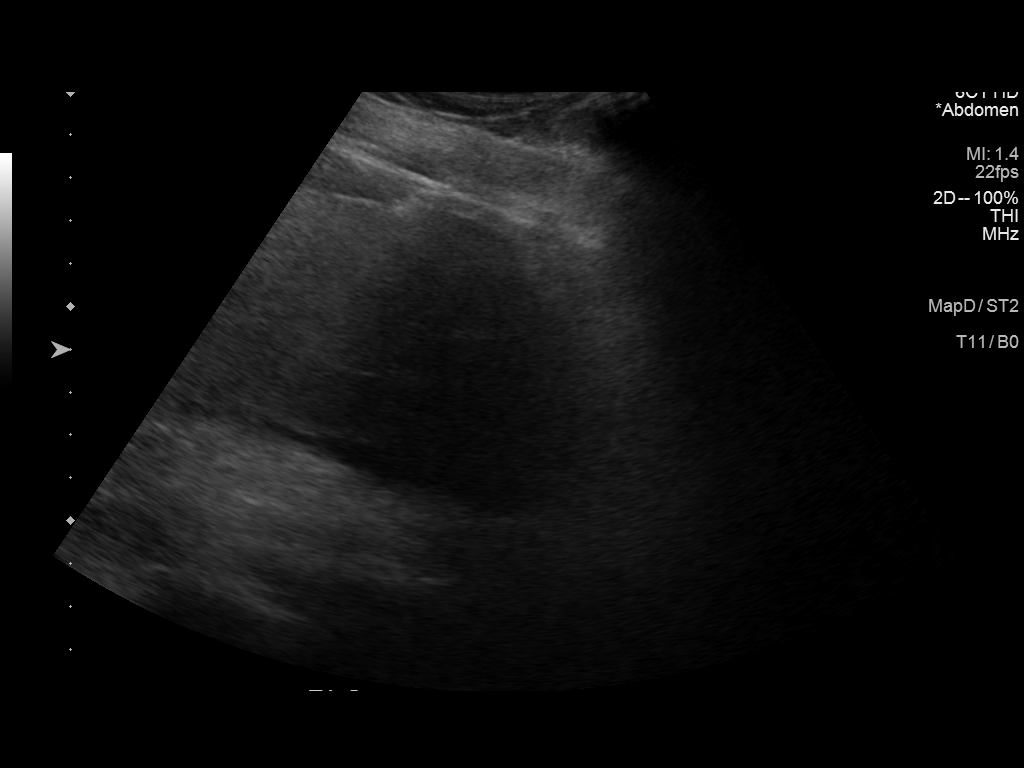
[im 5/5]
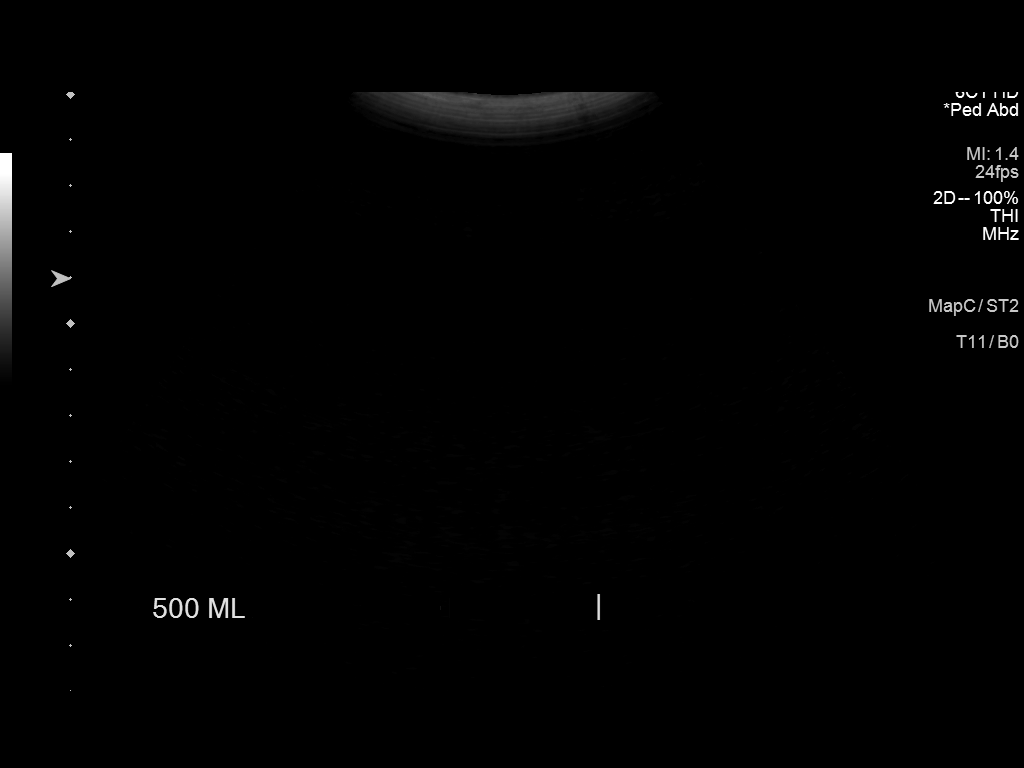

[5 of 5 positions shown; findings below may reference images not displayed]

EXAM:
ULTRASOUND GUIDED PARACENTESIS

MEDICATIONS:
None.

COMPLICATIONS:
None immediate.

PROCEDURE:
Informed written consent was obtained from the patient after a
discussion of the risks, benefits and alternatives to treatment. A
timeout was performed prior to the initiation of the procedure.

Ultrasound imaging demonstrates a small to moderate amount of
ascites on both sides of the abdomen. Location of the fluid was
difficult for paracentesis. Most accessible site was in the inferior
perihepatic region. Right side of the abdomen was prepped with
chlorhexidine and a sterile field was created. Skin was anesthetized
with 1% lidocaine. A Safe-T-Centesis catheter was directed into the
peritoneal cavity. This was very uncomfortable for the patient and
this catheter was tenting the peritoneum. Due to the adjacent liver,
the catheter cannot be safely advanced.

Following this, a Yueh catheter was introduced with ultrasound
guidance. Yueh catheter was able to advance into the ascites easier
than the Safe-T-Centesis catheter. The paracentesis was performed.
The catheter was removed and a dressing was applied. The patient
tolerated the procedure well without immediate post procedural
complication.
FINDINGS: A total of approximately 500 mL of amber color fluid was removed.
Samples were sent to the laboratory as requested by the clinical
team.
IMPRESSION: Successful ultrasound-guided paracentesis yielding 500 mL of
peritoneal fluid.
